# Patient Record
Sex: Female | Born: 1960 | Race: Black or African American | Hispanic: No | Marital: Single | Smoking: Never smoker
Health system: Southern US, Community
[De-identification: ages and names within clinical notes are randomized; demographics above are authoritative.]

## PROBLEM LIST (undated history)

## (undated) DIAGNOSIS — D573 Sickle-cell trait: Secondary | ICD-10-CM

## (undated) DIAGNOSIS — R51 Headache: Secondary | ICD-10-CM

## (undated) DIAGNOSIS — Z9851 Tubal ligation status: Secondary | ICD-10-CM

## (undated) DIAGNOSIS — J189 Pneumonia, unspecified organism: Secondary | ICD-10-CM

## (undated) DIAGNOSIS — Z789 Other specified health status: Secondary | ICD-10-CM

## (undated) HISTORY — PX: FOOT SURGERY: SHX648

## (undated) HISTORY — PX: COLONOSCOPY: SHX174

## (undated) HISTORY — PX: TUBAL LIGATION: SHX77

---

## 2012-09-16 ENCOUNTER — Encounter (HOSPITAL_COMMUNITY): Payer: Self-pay | Admitting: Pharmacy Technician

## 2012-09-17 ENCOUNTER — Encounter (HOSPITAL_COMMUNITY)
Admission: RE | Admit: 2012-09-17 | Discharge: 2012-09-17 | Disposition: A | Discharge status: Home / Self Care | Payer: Worker's Compensation | Source: Ambulatory Visit | Attending: Orthopedic Surgery | Admitting: Orthopedic Surgery

## 2012-09-17 ENCOUNTER — Encounter (HOSPITAL_COMMUNITY): Payer: Self-pay

## 2012-09-17 DIAGNOSIS — Z01812 Encounter for preprocedural laboratory examination: Secondary | ICD-10-CM | POA: Insufficient documentation

## 2012-09-17 HISTORY — DX: Other specified health status: Z78.9

## 2012-09-17 HISTORY — DX: Tubal ligation status: Z98.51

## 2012-09-17 HISTORY — DX: Headache: R51

## 2012-09-17 LAB — BASIC METABOLIC PANEL
CO2: 27 mEq/L (ref 19–32)
Calcium: 9.6 mg/dL (ref 8.4–10.5)
Creatinine, Ser: 0.76 mg/dL (ref 0.50–1.10)
GFR calc non Af Amer: >90 mL/min (ref >90)
Glucose, Bld: 108 mg/dL — ABNORMAL HIGH (ref 70–99)
Sodium: 140 mEq/L (ref 135–145)

## 2012-09-17 LAB — CBC
MCH: 22.2 pg — ABNORMAL LOW (ref 26.0–34.0)
MCV: 65.2 fL — ABNORMAL LOW (ref 78.0–100.0)
Platelets: 536 10*3/uL — ABNORMAL HIGH (ref 150–400)
RDW: 17.8 % — ABNORMAL HIGH (ref 11.5–15.5)
WBC: 7.1 10*3/uL (ref 4.0–10.5)

## 2012-09-17 LAB — SURGICAL PCR SCREEN: Staphylococcus aureus: NEGATIVE

## 2012-09-17 NOTE — Pre-Procedure Instructions (Signed)
KAOIR LOREE  09/17/2012   Your procedure is scheduled on:  09/23/12  Report to [facility] at 8 AM.  Call this number if you have problems the morning of surgery: [phone]   Remember:   Do not eat food or drink liquids after midnight.   Take these medicines the morning of surgery with A SIP OF WATER: none   Do not wear jewelry, make-up or nail polish.  Do not wear lotions, powders, or perfumes. You may wear deodorant.  Do not shave 48 hours prior to surgery. Men may shave face and neck.  Do not bring valuables to the hospital.  [facility] is not responsible                   for any belongings or valuables.  Contacts, dentures or bridgework may not be worn into surgery.  Leave suitcase in the car. After surgery it may be brought to your room.  For patients admitted to the hospital, checkout time is 11:00 AM the day of  discharge.   Patients discharged the day of surgery will not be allowed to drive  home.  Name and phone number of your driver: family  Special Instructions: Shower using CHG 2 nights before surgery and the night before surgery.  If you shower the day of surgery use CHG.  Use special wash - you have one bottle of CHG for all showers.  You should use approximately 1/3 of the bottle for each shower.   Please read over the following fact sheets that you were given: Pain Booklet, Coughing and Deep Breathing, MRSA Information and Surgical Site Infection Prevention

## 2012-09-20 NOTE — H&P (Signed)
Subjective Transcription  She returns today for follow up. At this point I have not seen her since May and she continues to have significant right deltoid pain and now left trapezial pain. Since I last saw her the left side of her neck has become more symptomatic. She occasionally gets some pain into the arm but most of her pain is still neck, right scapula and right proximal humerus. X-rays today in my office show no significant collapse of the disc spaces, no spondylolisthesis or spondylolysis. No sudden loss of lordosis. 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 There are no cord signal changes.   Allergies No Known Drug Allergies. 12/10/2011   Family History Cancer. sister   Social History Alcohol use. current drinker; only occasionally per week Children. 2 Current work status. working part time Drug/Alcohol Rehab (Currently). no Drug/Alcohol Rehab (Previously). no Exercise. Exercises daily; does other Illicit drug use. no Marital status. single Number of flights of stairs before winded. 1 Pain Contract. no Tobacco / smoke exposure. no Tobacco use. never smoker Current occupation. [employer]   Medication History Advil (200MG  Capsule, 1 (one) Oral) Active.   Past Surgical History Cesarean Delivery. 2 times Foot Surgery. bilateral   Other Problems No pertinent past medical history  Objective Transcription  She is a pleasant woman who appears younger than her stated age. She is alert and oriented times three. No shortness of breath or chest pain. The abdomen is soft, nontender. She has pain with cervical spine range of motion.  She has trace weakness of the right deltoid, 5/5 in the remainder of the upper extremity. Normal sensation in C6, C7, C8 distribution. Slightly diminished sensation over the lateral aspect of the right humerus. Normal gait pattern. Negative Babinski test. No clonus. Negative Hoffman sign. 1+ symmetrical upper extremity deep tendon reflexes. No incontinence of bowel or bladder.  IMAGES:  Her MRI shows a moderate stenosis at C5-6. The right neuroforamen is patent. There is moderate left foraminal stenosis. At C3-4 there is moderate-to-severe left lateral recessed stenosis. Minimal right foraminal narrowing. No cord signal changes.  Significant foraminal stenosis at C4/5  Plans Transcription  At this point in time I have had a long discussion with the patient about her care. At this point I think since she is having more and more left sided trapezial/C4 pain I think including the 3-4 level is a good idea. My concern is the C5-6 level. Radiographically there is a central disc protrusion but clinically she has no C6 radiculopathy, (neither motor nor sensory deficits) and there is no significant foraminal compromise. This is moderate disease and I do not think doing a three level procedure is worth while. I told her the down side to this is with a two level fusion above it we could transfer stress to that level, it could break down and become symptomatic. However, her plain x-rays show that the disc space is well maintained, there is no advanced collapse of that disc or traction spur or degenerative changes at that C5-6 level. At this point the risks of a two level fusion include infection, bleeding, nerve damage, death, stroke, paralysis, failure to heal, need for further surgery, throat pain, swallowing difficulties, hoarseness in the  voice, need for further surgery and it does not fuse. Because we are doing a multilevel procedure I will use an external bone stimulator after surgery for about 9 to 12 months to  encourage the fusion. She has expressed an understanding of the risks and benefits and she is familiar with the procedure. We will be planning to proceed with this next week.

## 2012-09-22 MED ORDER — CEFAZOLIN SODIUM-DEXTROSE 2-3 GM-% IV SOLR
2.0000 g | INTRAVENOUS | Status: AC
  Administered 2012-09-23: 2 g via INTRAVENOUS
  Filled 2012-09-22: qty 50

## 2012-09-23 ENCOUNTER — Ambulatory Visit (HOSPITAL_COMMUNITY): Payer: Worker's Compensation | Admitting: Certified Registered"

## 2012-09-23 ENCOUNTER — Ambulatory Visit (HOSPITAL_COMMUNITY): Payer: Worker's Compensation

## 2012-09-23 ENCOUNTER — Encounter (HOSPITAL_COMMUNITY): Payer: Self-pay | Admitting: *Deleted

## 2012-09-23 ENCOUNTER — Observation Stay (HOSPITAL_COMMUNITY): Payer: Worker's Compensation

## 2012-09-23 ENCOUNTER — Observation Stay (HOSPITAL_COMMUNITY)
Admission: RE | Admit: 2012-09-23 | Discharge: 2012-09-24 | Disposition: A | Discharge status: Home / Self Care | Payer: Worker's Compensation | Source: Ambulatory Visit | Attending: Orthopedic Surgery | Admitting: Orthopedic Surgery

## 2012-09-23 ENCOUNTER — Encounter (HOSPITAL_COMMUNITY)
Admission: RE | Disposition: A | Discharge status: Home / Self Care | Payer: Self-pay | Source: Ambulatory Visit | Attending: Orthopedic Surgery

## 2012-09-23 ENCOUNTER — Encounter (HOSPITAL_COMMUNITY): Payer: Self-pay | Admitting: Certified Registered"

## 2012-09-23 DIAGNOSIS — Z01818 Encounter for other preprocedural examination: Principal | ICD-10-CM | POA: Insufficient documentation

## 2012-09-23 HISTORY — PX: ANTERIOR CERVICAL DECOMP/DISCECTOMY FUSION: SHX1161

## 2012-09-23 SURGERY — ANTERIOR CERVICAL DECOMPRESSION/DISCECTOMY FUSION 1 LEVEL
Anesthesia: General | Site: Spine Cervical | Wound class: Clean

## 2012-09-23 MED ORDER — ONDANSETRON HCL 4 MG/2ML IJ SOLN
INTRAMUSCULAR | Status: DC | PRN
  Administered 2012-09-23: 4 mg via INTRAVENOUS

## 2012-09-23 MED ORDER — METHOCARBAMOL 100 MG/ML IJ SOLN: 500.0000 mg | Freq: Four times a day (QID) | INTRAVENOUS | Status: DC | PRN

## 2012-09-23 MED ORDER — FENTANYL CITRATE 0.05 MG/ML IJ SOLN
INTRAMUSCULAR | Status: DC | PRN
  Administered 2012-09-23: 50 ug via INTRAVENOUS
  Administered 2012-09-23: 150 ug via INTRAVENOUS
  Administered 2012-09-23: 50 ug via INTRAVENOUS

## 2012-09-23 MED ORDER — PHENYLEPHRINE HCL 10 MG/ML IJ SOLN
10.0000 mg | INTRAVENOUS | Status: DC | PRN
  Administered 2012-09-23: 25 ug/min via INTRAVENOUS

## 2012-09-23 MED ORDER — DEXAMETHASONE SODIUM PHOSPHATE 4 MG/ML IJ SOLN
4.0000 mg | Freq: Four times a day (QID) | INTRAMUSCULAR | Status: DC
  Administered 2012-09-23 – 2012-09-24 (×3): 4 mg via INTRAVENOUS
  Filled 2012-09-23 (×7): qty 1

## 2012-09-23 MED ORDER — MIDAZOLAM HCL 5 MG/5ML IJ SOLN
INTRAMUSCULAR | Status: DC | PRN
  Administered 2012-09-23: 2 mg via INTRAVENOUS

## 2012-09-23 MED ORDER — HYDROMORPHONE HCL PF 1 MG/ML IJ SOLN
0.2500 mg | INTRAMUSCULAR | Status: DC | PRN
  Administered 2012-09-23 (×2): 0.5 mg via INTRAVENOUS

## 2012-09-23 MED ORDER — ALBUMIN HUMAN 5 % IV SOLN
INTRAVENOUS | Status: DC | PRN
  Administered 2012-09-23: 11:00:00 via INTRAVENOUS

## 2012-09-23 MED ORDER — METHOCARBAMOL 500 MG PO TABS: 500.0000 mg | ORAL_TABLET | Freq: Four times a day (QID) | ORAL | Status: DC | PRN

## 2012-09-23 MED ORDER — SODIUM CHLORIDE 0.9 % IV SOLN: 250.0000 mL | INTRAVENOUS | Status: DC

## 2012-09-23 MED ORDER — LIDOCAINE HCL (CARDIAC) 20 MG/ML IV SOLN
INTRAVENOUS | Status: DC | PRN
  Administered 2012-09-23: 50 mg via INTRAVENOUS

## 2012-09-23 MED ORDER — GLYCOPYRROLATE 0.2 MG/ML IJ SOLN
INTRAMUSCULAR | Status: DC | PRN
  Administered 2012-09-23: .4 mg via INTRAVENOUS

## 2012-09-23 MED ORDER — DEXAMETHASONE 4 MG PO TABS
4.0000 mg | ORAL_TABLET | Freq: Four times a day (QID) | ORAL | Status: DC
  Filled 2012-09-23 (×7): qty 1

## 2012-09-23 MED ORDER — PHENOL 1.4 % MT LIQD: 1.0000 | OROMUCOSAL | Status: DC | PRN

## 2012-09-23 MED ORDER — ZOLPIDEM TARTRATE 5 MG PO TABS: 5.0000 mg | ORAL_TABLET | Freq: Every evening | ORAL | Status: DC | PRN

## 2012-09-23 MED ORDER — OXYCODONE HCL 5 MG PO TABS: 5.0000 mg | ORAL_TABLET | Freq: Once | ORAL | Status: DC | PRN

## 2012-09-23 MED ORDER — THROMBIN 20000 UNITS EX SOLR
CUTANEOUS | Status: DC | PRN
  Administered 2012-09-23: 12:00:00 via TOPICAL

## 2012-09-23 MED ORDER — ROCURONIUM BROMIDE 100 MG/10ML IV SOLN
INTRAVENOUS | Status: DC | PRN
  Administered 2012-09-23: 10 mg via INTRAVENOUS
  Administered 2012-09-23: 40 mg via INTRAVENOUS

## 2012-09-23 MED ORDER — PROPOFOL 10 MG/ML IV BOLUS
INTRAVENOUS | Status: DC | PRN
  Administered 2012-09-23: 120 mg via INTRAVENOUS

## 2012-09-23 MED ORDER — THROMBIN 20000 UNITS EX SOLR
CUTANEOUS | Status: AC
  Filled 2012-09-23: qty 20000

## 2012-09-23 MED ORDER — OXYCODONE HCL 5 MG/5ML PO SOLN: 5.0000 mg | Freq: Once | ORAL | Status: DC | PRN

## 2012-09-23 MED ORDER — DOCUSATE SODIUM 100 MG PO CAPS
100.0000 mg | ORAL_CAPSULE | Freq: Two times a day (BID) | ORAL | Status: DC
  Administered 2012-09-23: 100 mg via ORAL
  Filled 2012-09-23 (×3): qty 1

## 2012-09-23 MED ORDER — MORPHINE SULFATE 2 MG/ML IJ SOLN: 1.0000 mg | INTRAMUSCULAR | Status: DC | PRN

## 2012-09-23 MED ORDER — LACTATED RINGERS IV SOLN: INTRAVENOUS | Status: DC

## 2012-09-23 MED ORDER — DEXAMETHASONE SODIUM PHOSPHATE 4 MG/ML IJ SOLN: 4.0000 mg | Freq: Once | INTRAMUSCULAR | Status: DC

## 2012-09-23 MED ORDER — BUPIVACAINE-EPINEPHRINE PF 0.25-1:200000 % IJ SOLN
INTRAMUSCULAR | Status: AC
  Filled 2012-09-23: qty 30

## 2012-09-23 MED ORDER — DEXAMETHASONE SODIUM PHOSPHATE 10 MG/ML IJ SOLN
INTRAMUSCULAR | Status: AC
  Administered 2012-09-23: 10 mg via INTRAVENOUS
  Filled 2012-09-23: qty 1

## 2012-09-23 MED ORDER — BUPIVACAINE-EPINEPHRINE 0.25% -1:200000 IJ SOLN
INTRAMUSCULAR | Status: DC | PRN
  Administered 2012-09-23: 6 mL

## 2012-09-23 MED ORDER — CEFAZOLIN SODIUM 1-5 GM-% IV SOLN
1.0000 g | Freq: Three times a day (TID) | INTRAVENOUS | Status: AC
Start: 2012-09-23 — End: 2012-09-24
  Administered 2012-09-23 – 2012-09-24 (×2): 1 g via INTRAVENOUS
  Filled 2012-09-23 (×2): qty 50

## 2012-09-23 MED ORDER — EPHEDRINE SULFATE 50 MG/ML IJ SOLN
INTRAMUSCULAR | Status: DC | PRN
  Administered 2012-09-23: 5 mg via INTRAVENOUS

## 2012-09-23 MED ORDER — ACETAMINOPHEN 10 MG/ML IV SOLN
1000.0000 mg | Freq: Four times a day (QID) | INTRAVENOUS | Status: DC
  Administered 2012-09-23: 1000 mg via INTRAVENOUS

## 2012-09-23 MED ORDER — ACETAMINOPHEN 10 MG/ML IV SOLN
1000.0000 mg | Freq: Four times a day (QID) | INTRAVENOUS | Status: DC
  Administered 2012-09-23 – 2012-09-24 (×3): 1000 mg via INTRAVENOUS
  Filled 2012-09-23 (×4): qty 100

## 2012-09-23 MED ORDER — OXYCODONE HCL 5 MG PO TABS
10.0000 mg | ORAL_TABLET | ORAL | Status: DC | PRN
  Administered 2012-09-24: 5 mg via ORAL
  Administered 2012-09-24: 10 mg via ORAL
  Filled 2012-09-23: qty 2
  Filled 2012-09-23: qty 1

## 2012-09-23 MED ORDER — ONDANSETRON HCL 4 MG/2ML IJ SOLN
4.0000 mg | INTRAMUSCULAR | Status: DC | PRN
  Administered 2012-09-23: 4 mg via INTRAVENOUS
  Filled 2012-09-23: qty 2

## 2012-09-23 MED ORDER — ARTIFICIAL TEARS OP OINT
TOPICAL_OINTMENT | OPHTHALMIC | Status: DC | PRN
  Administered 2012-09-23: 1 via OPHTHALMIC

## 2012-09-23 MED ORDER — LACTATED RINGERS IV SOLN
INTRAVENOUS | Status: DC | PRN
  Administered 2012-09-23 (×2): via INTRAVENOUS

## 2012-09-23 MED ORDER — HYDROMORPHONE HCL PF 1 MG/ML IJ SOLN
INTRAMUSCULAR | Status: AC
  Filled 2012-09-23: qty 1

## 2012-09-23 MED ORDER — 0.9 % SODIUM CHLORIDE (POUR BTL) OPTIME
TOPICAL | Status: DC | PRN
  Administered 2012-09-23: 1000 mL

## 2012-09-23 MED ORDER — ACETAMINOPHEN 10 MG/ML IV SOLN
1000.0000 mg | INTRAVENOUS | Status: DC
  Filled 2012-09-23: qty 100

## 2012-09-23 MED ORDER — SODIUM CHLORIDE 0.9 % IJ SOLN: 3.0000 mL | INTRAMUSCULAR | Status: DC | PRN

## 2012-09-23 MED ORDER — WHITE PETROLATUM GEL
Status: AC
  Filled 2012-09-23: qty 5

## 2012-09-23 MED ORDER — SODIUM CHLORIDE 0.9 % IJ SOLN
3.0000 mL | Freq: Two times a day (BID) | INTRAMUSCULAR | Status: DC
  Administered 2012-09-24: 3 mL via INTRAVENOUS

## 2012-09-23 MED ORDER — NEOSTIGMINE METHYLSULFATE 1 MG/ML IJ SOLN
INTRAMUSCULAR | Status: DC | PRN
  Administered 2012-09-23: 3 mg via INTRAVENOUS

## 2012-09-23 MED ORDER — MENTHOL 3 MG MT LOZG: 1.0000 | LOZENGE | OROMUCOSAL | Status: DC | PRN

## 2012-09-23 MED ORDER — ACETAMINOPHEN 10 MG/ML IV SOLN: 1000.0000 mg | Freq: Four times a day (QID) | INTRAVENOUS | Status: DC

## 2012-09-23 MED ORDER — PHENYLEPHRINE HCL 10 MG/ML IJ SOLN
INTRAMUSCULAR | Status: DC | PRN
  Administered 2012-09-23: 120 ug via INTRAVENOUS

## 2012-09-23 MED ORDER — ONDANSETRON HCL 4 MG/2ML IJ SOLN: 4.0000 mg | Freq: Once | INTRAMUSCULAR | Status: DC | PRN

## 2012-09-23 MED ORDER — LACTATED RINGERS IV SOLN
INTRAVENOUS | Status: DC
  Administered 2012-09-23: 18:00:00 via INTRAVENOUS

## 2012-09-23 SURGICAL SUPPLY — 60 items
BIT DRILL SRG 14X2.2XFLT CHK (BIT) ×1 IMPLANT
BIT DRL SRG 14X2.2XFLT CHK (BIT) ×1
BLADE SURG ROTATE 9660 (MISCELLANEOUS) IMPLANT
BUR EGG ELITE 4.0 (BURR) IMPLANT
BUR MATCHSTICK NEURO 3.0 LAGG (BURR) IMPLANT
CANISTER SUCTION 2500CC (MISCELLANEOUS) ×2 IMPLANT
CLOTH BEACON ORANGE TIMEOUT ST (SAFETY) ×2 IMPLANT
CLSR STERI-STRIP ANTIMIC 1/2X4 (GAUZE/BANDAGES/DRESSINGS) ×2 IMPLANT
CORDS BIPOLAR (ELECTRODE) ×2 IMPLANT
COVER SURGICAL LIGHT HANDLE (MISCELLANEOUS) ×4 IMPLANT
CRADLE DONUT ADULT HEAD (MISCELLANEOUS) ×2 IMPLANT
DRAPE C-ARM 42X72 X-RAY (DRAPES) ×4 IMPLANT
DRAPE POUCH INSTRU U-SHP 10X18 (DRAPES) ×2 IMPLANT
DRAPE SURG 17X23 STRL (DRAPES) ×2 IMPLANT
DRAPE U-SHAPE 47X51 STRL (DRAPES) ×2 IMPLANT
DRILL BIT SKYLINE 14MM (BIT) ×1
DRSG MEPILEX BORDER 4X4 (GAUZE/BANDAGES/DRESSINGS) ×2 IMPLANT
DURAPREP 26ML APPLICATOR (WOUND CARE) ×2 IMPLANT
ELECT COATED BLADE 2.86 ST (ELECTRODE) ×2 IMPLANT
ELECT REM PT RETURN 9FT ADLT (ELECTROSURGICAL) ×2
ELECTRODE REM PT RTRN 9FT ADLT (ELECTROSURGICAL) ×1 IMPLANT
GLOVE BIOGEL PI IND STRL 8.5 (GLOVE) ×1 IMPLANT
GLOVE BIOGEL PI INDICATOR 8.5 (GLOVE) ×1
GLOVE ECLIPSE 8.5 STRL (GLOVE) ×2 IMPLANT
GOWN PREVENTION PLUS XXLARGE (GOWN DISPOSABLE) ×2 IMPLANT
GOWN STRL REIN XL XLG (GOWN DISPOSABLE) ×4 IMPLANT
INTERLOCK LRDTC CRVCL VBR 7MM (Bone Implant) ×1 IMPLANT
INTERLOCK LRDTC CRVCL VBR 8MM (Peek) ×1 IMPLANT
KIT BASIN OR (CUSTOM PROCEDURE TRAY) ×2 IMPLANT
KIT ROOM TURNOVER OR (KITS) ×2 IMPLANT
LORDOTIC CERVICAL VBR 7MM SM (Bone Implant) ×2 IMPLANT
LORDOTIC CERVICAL VBR 8MM SM (Peek) ×2 IMPLANT
NEEDLE SPNL 18GX3.5 QUINCKE PK (NEEDLE) ×6 IMPLANT
NS IRRIG 1000ML POUR BTL (IV SOLUTION) ×2 IMPLANT
PACK ORTHO CERVICAL (CUSTOM PROCEDURE TRAY) ×2 IMPLANT
PACK UNIVERSAL I (CUSTOM PROCEDURE TRAY) ×2 IMPLANT
PAD ARMBOARD 7.5X6 YLW CONV (MISCELLANEOUS) ×4 IMPLANT
PATTIES SURGICAL .25X.25 (GAUZE/BANDAGES/DRESSINGS) ×2 IMPLANT
PIN DISTRACTION 14 (PIN) ×4 IMPLANT
PIN TEMP SKYLINE THREADED (PIN) ×2 IMPLANT
PUTTY BONE DBX 5CC MIX (Putty) ×2 IMPLANT
RESTRAINT LIMB HOLDER UNIV (RESTRAINTS) ×2 IMPLANT
SCREW SKYLINE 14MM SD-VA (Screw) ×10 IMPLANT
SCREW SKYLINE VAR OS 14MM (Screw) ×2 IMPLANT
SPONGE INTESTINAL PEANUT (DISPOSABLE) ×4 IMPLANT
SPONGE SURGIFOAM ABS GEL 100 (HEMOSTASIS) ×2 IMPLANT
SURGIFLO TRUKIT (HEMOSTASIS) IMPLANT
SUT MNCRL AB 3-0 PS2 18 (SUTURE) ×2 IMPLANT
SUT MON AB 3-0 SH 27 (SUTURE) ×1
SUT MON AB 3-0 SH27 (SUTURE) ×1 IMPLANT
SUT SILK 2 0 (SUTURE)
SUT SILK 2-0 18XBRD TIE 12 (SUTURE) IMPLANT
SUT VIC AB 2-0 CT1 18 (SUTURE) ×2 IMPLANT
SYR BULB IRRIGATION 50ML (SYRINGE) ×2 IMPLANT
SYR CONTROL 10ML LL (SYRINGE) ×2 IMPLANT
TAPE CLOTH 4X10 WHT NS (GAUZE/BANDAGES/DRESSINGS) ×2 IMPLANT
TAPE UMBILICAL COTTON 1/8X30 (MISCELLANEOUS) ×2 IMPLANT
TOWEL OR 17X24 6PK STRL BLUE (TOWEL DISPOSABLE) ×2 IMPLANT
TOWEL OR 17X26 10 PK STRL BLUE (TOWEL DISPOSABLE) ×2 IMPLANT
WATER STERILE IRR 1000ML POUR (IV SOLUTION) IMPLANT

## 2012-09-23 NOTE — Anesthesia Procedure Notes (Signed)
Procedure Name: Intubation Date/Time: 09/23/2012 10:30 AM Performed by: Ellin Goodie Pre-anesthesia Checklist: Patient identified, Emergency Drugs available, Suction available, Patient being monitored and Timeout performed Patient Re-evaluated:Patient Re-evaluated prior to inductionOxygen Delivery Method: Circle system utilized Preoxygenation: Pre-oxygenation with 100% oxygen Intubation Type: IV induction Ventilation: Mask ventilation without difficulty and Oral airway inserted - appropriate to patient size Laryngoscope Size: Mac and 3 Grade View: Grade IV Tube type: Oral Tube size: 7.5 mm Number of attempts: 2 Airway Equipment and Method: Stylet and Bougie stylet Placement Confirmation: positive ETCO2 and breath sounds checked- equal and bilateral Secured at: 21 cm Tube secured with: Tape Dental Injury: Teeth and Oropharynx as per pre-operative assessment  Difficulty Due To: Difficulty was anticipated, Difficult Airway- due to anterior larynx, Difficult Airway- due to limited oral opening and Difficult Airway-  due to neck instability Comments: DL x 1 with MAC 3 blade with poor view.  Repositioned patient and bougie stylet utilized on second attempt with MAC 3 blade, bougie passed with ease, ETT threaded over bougie.  Dr. Noreene Larsson verified placement of ETT.  Carlynn Herald, CRNA

## 2012-09-23 NOTE — Op Note (Signed)
NAMEMarland Kitchen  KIMBERLYANN, HOLLAR NO.:  [id number]  MEDICAL RECORD NO.:  [id number]  LOCATION:  5N10C                        FACILITY:  [facility]  PHYSICIAN:  Alvy Beal, MD    DATE OF BIRTH:  Mar 20, 1960  DATE OF PROCEDURE:  09/23/2012 DATE OF DISCHARGE:                              OPERATIVE REPORT   PREOPERATIVE DIAGNOSIS:  Cervical spondylotic radiculopathy, C3-4, C4-5.  POSTOPERATIVE DIAGNOSIS:  Cervical spondylotic radiculopathy, C3-4, C4- 5.  OPERATIVE PROCEDURE:  Anterior cervical diskectomy and fusion C3-4, C4- 5.  COMPLICATIONS:  None.  FIRST ASSISTANT:  Genene Churn. Barry Dienes, PA-C.  INSTRUMENTATION SYSTEM USED:  Titan intervertebral cage, size 7 lordotic at 3, 4 and size 8 small lordotic at 4-5 packed with DBX mix with a 30 mm anterior cervical DePuy Skyline plate with 14 mm locking screws.  HISTORY:  This is a very pleasant woman who has had progressive debilitating neck and bilateral arm pain for the last 7 months.  Despite appropriate conservative care, she continued to deteriorate.  As a result of the failure to improve, we elected to proceed with surgery. All appropriate risks, benefits, and alternatives were discussed with the patient.  OPERATIVE NOTE:  The patient was brought to the operating room, placed supine on the operating table.  After successful induction of general anesthesia and endotracheal intubation, TEDs, SCDs were applied.  Rolled towels were placed between the shoulder blades.  The arms were secured down at the side and the anterior cervical spine was prepped and draped in the standard fashion.  Time-out was taken to confirm the patient procedure and all other pertinent important data.  Transverse incision was made centered over the C4 vertebral body.  This started in the midline and proceeding to the left.  Sharp dissection was carried out down to and through the platysma.  I then sharply dissected along the medial border of the  sternocleidomastoid through the deep cervical and prevertebral fascia.  I swept the esophagus, lateral to the right and identified and protected the carotid sheath with a finger.  I could not palpate the anterior cervical spine.  Thyroid retractor was placed.  I placed a needle into the 4-5 disk space and took an x-ray to confirm that I was at the appropriate level.  Once this was done, I then used bipolar electrocautery to mobilize the longus coli muscles from the midbody of C3 to the midbody of C5.  I obtained hemostasis.  I then placed a self-retaining retractor blades underneath the longus coli muscle.  Endotracheal cuff was deflated.  I expanded the retractor to the appropriate width and reinflated the cuff. Distraction pins were placed into the body of 3 and 4 and then an annulotomy was performed with a 15 blade scalpel.  Using a pituitary rongeur, curettes and Kerrison rongeurs, I removed all of the disk material.  I then released the posterior anulus and using a 1 mm Kerrison, I resected the posterior osteophyte from the bodies of 3 and 4.  The uncovertebral joints were also debrided.  At this point, I had an excellent decompression of this level.  I rasped the endplates.  I made sure I had complete removal  of the cartilaginous endplate.  Once I had bleeding subchondral bone, I then trialed and placed a 7 small intervertebral spacer, packed with DBX mix.  This was malleted down to the appropriate depth.  I had excellent purchase.  I then removed the distraction pin from C3 repositioned into C5 and then distracted the 4-5 disk space.  Using the same technique, I just used the 3-4.  I completed the diskectomy at 4-5.  Again releasing the posterior anulus and resecting the osteophytes, there was a small hard disk bone spur, as well as some component of soft disk material in the right posterolateral corner which was removed.  Once I had an adequate decompression, I again rasped the  endplates until I had bleeding subchondral bone.  Once this was confirmed, I used an 8 small intervertebral spacer, packed with DBX mix with both of impact down appropriately and then contoured an anterior cervical plate and affixed it with self-drilling screws into the bodies of C3, 4 and 5 with 14 mm screws.  All screws had excellent purchase.  I then checked to ensure the esophagus was not entrapped beneath the plate and it was not and then I locked the screws in place per manufactures standards.  I irrigated the wound copiously with normal saline and then obtained hemostasis using bipolar electrocautery.  I then returned the trachea and esophagus to the midline position, closed the platysma with interrupted 2-0 Vicryl sutures, and 3-0 Monocryl for the skin.  Steri-Strips and dry dressing were applied.  The patient was ultimately extubated and transferred to the PACU without incident.  At the end of the case, all needle and sponge counts were correct.     Alvy Beal, MD     DDB/MEDQ  D:  09/23/2012  T:  09/23/2012  Job:  [id number]

## 2012-09-23 NOTE — Transfer of Care (Signed)
Immediate Anesthesia Transfer of Care Note  Patient: Michaela Lopez  Procedure(s) Performed: Procedure(s): ACDF C3-4, 4-5 (N/A)  Patient Location: PACU  Anesthesia Type:General  Level of Consciousness: awake, alert  and oriented  Airway & Oxygen Therapy: Patient connected to face mask oxygen  Post-op Assessment: Report given to PACU RN  Post vital signs: stable  Complications: No apparent anesthesia complications

## 2012-09-23 NOTE — Brief Op Note (Signed)
09/23/2012  1:19 PM  PATIENT:  [name]  52 y.o. female  PRE-OPERATIVE DIAGNOSIS:  CERVICAL RADICULOPATHY  POST-OPERATIVE DIAGNOSIS:  CERVICAL RADICULOPATHY  PROCEDURE:  Procedure(s): ACDF C3-4, 4-5 (N/A)  SURGEON:  Surgeon(s) and Role:    * Venita Lick, MD - Primary  PHYSICIAN ASSISTANT:   ASSISTANTS: Zonia Kief  ANESTHESIA:   general  EBL:  Total I/O In: 1250 [I.V.:1000; IV Piggyback:250] Out: -   BLOOD ADMINISTERED:none  DRAINS: none   LOCAL MEDICATIONS USED:  MARCAINE     SPECIMEN:  No Specimen  DISPOSITION OF SPECIMEN:  N/A  COUNTS:  YES  TOURNIQUET:  * No tourniquets in log *  DICTATION: .Other Dictation: Dictation Number [id number]  PLAN OF CARE: Admit for overnight observation  PATIENT DISPOSITION:  PACU - hemodynamically stable.

## 2012-09-23 NOTE — H&P (Signed)
H+P reviewed  No change in clinical exam 

## 2012-09-23 NOTE — Preoperative (Signed)
Beta Blockers   Reason not to administer Beta Blockers:Not Applicable 

## 2012-09-23 NOTE — Anesthesia Postprocedure Evaluation (Signed)
  Anesthesia Post-op Note  Patient: Michaela Lopez  Procedure(s) Performed: Procedure(s): ACDF C3-4, 4-5 (N/A)  Patient Location: PACU  Anesthesia Type:General  Level of Consciousness: awake, alert  and oriented  Airway and Oxygen Therapy: Patient Spontanous Breathing and Patient connected to nasal cannula oxygen  Post-op Pain: mild  Post-op Assessment: Post-op Vital signs reviewed, Patient's Cardiovascular Status Stable, Respiratory Function Stable, Patent Airway and Pain level controlled  Post-op Vital Signs: stable  Complications: No apparent anesthesia complications

## 2012-09-23 NOTE — Plan of Care (Signed)
Problem: Consults Goal: Diagnosis - Spinal Surgery Cervical Spine Fusion: C3-C4, 4-5

## 2012-09-23 NOTE — Anesthesia Preprocedure Evaluation (Addendum)
Anesthesia Evaluation  Patient identified by MRN, date of birth, ID band Patient awake    Reviewed: Allergy & Precautions, H&P , NPO status , Patient's Chart, lab work & pertinent test results, reviewed documented beta blocker date and time   Airway Mallampati: II TM Distance: >3 FB Neck ROM: Limited    Dental  (+) Teeth Intact and Dental Advisory Given   Pulmonary  breath sounds clear to auscultation        Cardiovascular Rhythm:Regular Rate:Normal     Neuro/Psych  Headaches,    GI/Hepatic   Endo/Other    Renal/GU      Musculoskeletal   Abdominal   Peds  Hematology   Anesthesia Other Findings   Reproductive/Obstetrics                          Anesthesia Physical Anesthesia Plan  ASA: II  Anesthesia Plan: General   Post-op Pain Management:    Induction: Intravenous  Airway Management Planned: Oral ETT  Additional Equipment:   Intra-op Plan:   Post-operative Plan: Extubation in OR  Informed Consent: I have reviewed the patients History and Physical, chart, labs and discussed the procedure including the risks, benefits and alternatives for the proposed anesthesia with the patient or authorized representative who has indicated his/her understanding and acceptance.   Dental advisory given  Plan Discussed with:   Anesthesia Plan Comments: (Cervical spondylosis  Plan GA with oral ETT  Kipp Brood, MD)        Anesthesia Quick Evaluation

## 2012-09-24 ENCOUNTER — Encounter (HOSPITAL_COMMUNITY): Payer: Self-pay | Admitting: Orthopedic Surgery

## 2012-09-24 MED ORDER — ONDANSETRON HCL 4 MG PO TABS: 4.0000 mg | ORAL_TABLET | Freq: Three times a day (TID) | ORAL | Status: DC | PRN

## 2012-09-24 MED ORDER — DOCUSATE SODIUM 100 MG PO CAPS: 100.0000 mg | ORAL_CAPSULE | Freq: Three times a day (TID) | ORAL | Status: AC | PRN

## 2012-09-24 MED ORDER — METHOCARBAMOL 500 MG PO TABS: 500.0000 mg | ORAL_TABLET | Freq: Three times a day (TID) | ORAL | Status: DC | PRN

## 2012-09-24 MED ORDER — OXYCODONE-ACETAMINOPHEN 10-325 MG PO TABS: 1.0000 | ORAL_TABLET | ORAL | Status: DC | PRN

## 2012-09-24 MED ORDER — POLYETHYLENE GLYCOL 3350 17 GM/SCOOP PO POWD: 17.0000 g | Freq: Every day | ORAL | Status: DC

## 2012-09-24 NOTE — Progress Notes (Signed)
UR COMPLETED  

## 2012-09-24 NOTE — Progress Notes (Signed)
09/24/12 Contacted DR. Brooks office, workers comp cm is [employer] [phone]. Contacted Ms Orland Dec, she told me to call Tedd Sias [phone]. Contacted [employer],spoke with Olmsted Sink, they requested copy of orders, fax [phone]. They will set up HHPT and have 3N1 delivered to patient's home. Faxed orders and received confirmation.  Jacquelynn Cree RN, BSN, CCM     09/24/12 Contacted workers comp carrier, Barrister's clerk is Yetta Flock [phone]. Left voicemail requesting auth for HHPT, 3N1. Awaiting call back. Jacquelynn Cree RN, BSN, CCM

## 2012-09-24 NOTE — Discharge Summary (Signed)
Patient ID: Michaela Lopez MRN: [id number] DOB/AGE: April 02, 1960 52 y.o.  Admit date: 09/23/2012 Discharge date: 09/24/2012  Admission Diagnoses:  Active Problems:   * No active hospital problems. *   Discharge Diagnoses:  Active Problems:   * No active hospital problems. *  status post Procedure(s): ACDF C3-4, 4-5  Past Medical History  Diagnosis Date  . Headache(784.0)   . Medical history non-contributory   . H/O tubal ligation     Surgeries: Procedure(s): ACDF C3-4, 4-5 on 09/23/2012   Consultants:  none  Discharged Condition: Improved  Hospital Course: Michaela Lopez is an 52 y.o. female who was admitted 09/23/2012 for operative treatment of <principal problem not specified>. Patient failed conservative treatments (please see the history and physical for the specifics) and had severe unremitting pain that affects sleep, daily activities and work/hobbies. After pre-op clearance, the patient was taken to the operating room on 09/23/2012 and underwent  Procedure(s): ACDF C3-4, 4-5.    Patient was given perioperative antibiotics: Anti-infectives   Start     Dose/Rate Route Frequency Ordered Stop   09/23/12 1830  ceFAZolin (ANCEF) IVPB 1 g/50 mL premix     1 g 100 mL/hr over 30 Minutes Intravenous Every 8 hours 09/23/12 1736 09/24/12 0248   09/22/12 1421  ceFAZolin (ANCEF) IVPB 2 g/50 mL premix     2 g 100 mL/hr over 30 Minutes Intravenous 30 min pre-op 09/22/12 1421 09/23/12 1040       Patient was given sequential compression devices and early ambulation to prevent DVT.   Patient benefited maximally from hospital stay and there were no complications. At the time of discharge, the patient was urinating/moving their bowels without difficulty, tolerating a regular diet, pain is controlled with oral pain medications and they have been cleared by PT/OT.   Recent vital signs: Patient Vitals for the past 24 hrs:  BP Temp Temp src Pulse Resp SpO2  09/24/12 0635 140/66 mmHg  97.8 F (36.6 C) - 95 18 100 %  09/23/12 2108 134/62 mmHg 97.9 F (36.6 C) - 96 18 99 %  09/23/12 1740 138/64 mmHg 98.8 F (37.1 C) - 111 - 100 %  09/23/12 1632 - 97 F (36.1 C) - 114 20 99 %  09/23/12 1630 - - - 96 14 100 %  09/23/12 1618 119/55 mmHg - - 97 14 95 %  09/23/12 1615 - - - 110 20 100 %  09/23/12 1603 118/53 mmHg - - 107 14 99 %  09/23/12 1600 - - - 93 14 99 %  09/23/12 1548 131/53 mmHg - - 101 14 95 %  09/23/12 1545 - - - 109 15 99 %  09/23/12 1533 119/44 mmHg - - 106 17 93 %  09/23/12 1530 - - - 96 17 99 %  09/23/12 1518 120/36 mmHg - - 108 37 99 %  09/23/12 1515 - 97.4 F (36.3 C) - 116 20 99 %  09/23/12 1503 114/48 mmHg - - 93 13 95 %  09/23/12 1500 - - - 102 17 99 %  09/23/12 1448 117/46 mmHg - - 99 14 94 %  09/23/12 1445 - - - 94 17 99 %  09/23/12 1433 121/44 mmHg - - 97 17 98 %  09/23/12 1430 - - - 90 14 99 %  09/23/12 1418 116/60 mmHg - - 96 13 97 %  09/23/12 1415 - - - 96 12 98 %  09/23/12 1403 105/49 mmHg - - 97 14 88 %  09/23/12 1400 - - - 102 25 94 %  09/23/12 1348 120/50 mmHg - - 98 15 93 %  09/23/12 1345 - 97.3 F (36.3 C) - 105 - 100 %  09/23/12 0856 158/62 mmHg 97.3 F (36.3 C) Oral 94 18 100 %     Recent laboratory studies: No results found for this basename: WBC, HGB, HCT, PLT, NA, K, CL, CO2, BUN, CREATININE, GLUCOSE, PT, INR, CALCIUM, 2,  in the last 72 hours   Discharge Medications:     Medication List    STOP taking these medications       traMADol 50 MG tablet  Commonly known as:  ULTRAM      TAKE these medications       docusate sodium 100 MG capsule  Commonly known as:  COLACE  Take 1 capsule (100 mg total) by mouth 3 (three) times daily as needed for constipation.     methocarbamol 500 MG tablet  Commonly known as:  ROBAXIN  Take 1 tablet (500 mg total) by mouth 3 (three) times daily as needed.     ondansetron 4 MG tablet  Commonly known as:  ZOFRAN  Take 1 tablet (4 mg total) by mouth every 8 (eight) hours as needed  for nausea.     oxyCODONE-acetaminophen 10-325 MG per tablet  Commonly known as:  PERCOCET  Take 1 tablet by mouth every 4 (four) hours as needed for pain.     polyethylene glycol powder powder  Commonly known as:  GLYCOLAX  Take 17 g by mouth daily.        Diagnostic Studies: Dg Cervical Spine 2 Or 3 Views  09/23/2012   *RADIOLOGY REPORT*  Clinical Data: ACDF C3-4 and C4-5.  CERVICAL SPINE - 2-3 VIEW  Comparison: Intraoperative cervical spine x-rays earlier same date and two-view cervical spine x-rays performed preoperatively earlier same date.  Findings: Examination was performed in a cervical collar.  ACDF with hardware C3-C5 with interbody fusion plugs at C3-4 and C4-5. Alignment anatomic.  Fusion plugs properly positioned in the disc spaces.  No acute complicating features.  IMPRESSION: Anatomic alignment post ACDF with hardware C3-C5 and interbody fusion plugs at C3-4 and C4-5.  No complicating features.   Original Report Authenticated By: Hulan Saas, M.D.   Dg Cervical Spine 2-3 Views  09/23/2012   *RADIOLOGY REPORT*  Clinical Data: ACDF with hardware C3-C5.  OPERATIVE CERVICAL SPINE - 2-3 VIEW  Comparison:  Cervical spine x-rays earlier same date.  Findings: AP and lateral spot images of the cervical spine obtained with the C-arm fluoroscopic device and submitted for interpretation postoperatively demonstrate ACDF with hardware from C3-C5. Interbody fusion plugs at C3-4 and C4-5.  Plugs appropriately positioned in the disc spaces.  Anatomic alignment.  No visible complicating features.  IMPRESSION: Anatomic alignment post ACDF with hardware at C3-4 and C4-5 with interbody fusion plugs in the disc spaces.  No acute complicating features.   Original Report Authenticated By: Hulan Saas, M.D.   Dg Cervical Spine 2 Or 3 Views  09/23/2012   *RADIOLOGY REPORT*  Clinical Data: Neck pain.  CERVICAL SPINE - 2-3 VIEW  Comparison: None.  Findings: No fracture or spondylolisthesis is noted.   Minimal disc space narrowing is noted at C5-6 with minimal anterior osteophyte formation seen at this level as well as C4-5.  Posterior facet joints appear normal.  IMPRESSION: Minimal degenerative changes as described above.  No acute abnormality seen in cervical spine.   Original Report Authenticated By: Roque Lias  Montez Hageman.,  M.D.   Dg C-arm 61-120 Min  09/23/2012   *RADIOLOGY REPORT*  Clinical Data: ACDF with hardware C3-C5.  OPERATIVE CERVICAL SPINE - 2-3 VIEW  Comparison:  Cervical spine x-rays earlier same date.  Findings: AP and lateral spot images of the cervical spine obtained with the C-arm fluoroscopic device and submitted for interpretation postoperatively demonstrate ACDF with hardware from C3-C5. Interbody fusion plugs at C3-4 and C4-5.  Plugs appropriately positioned in the disc spaces.  Anatomic alignment.  No visible complicating features.  IMPRESSION: Anatomic alignment post ACDF with hardware at C3-4 and C4-5 with interbody fusion plugs in the disc spaces.  No acute complicating features.   Original Report Authenticated By: Hulan Saas, M.D.          Follow-up Information   Follow up with Alvy Beal, MD. Schedule an appointment as soon as possible for a visit in 2 weeks.   Specialty:  Orthopedic Surgery   Contact information:   [address] [place] [address] [phone]       Discharge Plan:  discharge to stable  Disposition: home    Signed: Venita Lick D for Dr. Venita Lick [facility] [phone] 09/24/2012, 7:57 AM

## 2012-09-24 NOTE — Progress Notes (Signed)
    Subjective: Procedure(s) (LRB): ACDF C3-4, 4-5 (N/A) 1 Day Post-Op  Patient reports pain as 2 on 0-10 scale.  Reports decreased arm pain reports incisional neck pain   Positive void Negative bowel movement Positive flatus Negative chest pain or shortness of breath  Objective: Vital signs in last 24 hours: Temp:  [97 F (36.1 C)-98.8 F (37.1 C)] 97.8 F (36.6 C) (08/29 0635) Pulse Rate:  [90-116] 95 (08/29 0635) Resp:  [12-37] 18 (08/29 0635) BP: (105-158)/(36-66) 140/66 mmHg (08/29 0635) SpO2:  [88 %-100 %] 100 % (08/29 0635)  Intake/Output from previous day: 08/28 0701 - 08/29 0700 In: 1695 [P.O.:120; I.V.:1325; IV Piggyback:250] Out: -   Labs: No results found for this basename: WBC, RBC, HCT, PLT,  in the last 72 hours No results found for this basename: NA, K, CL, CO2, BUN, CREATININE, GLUCOSE, CALCIUM,  in the last 72 hours No results found for this basename: LABPT, INR,  in the last 72 hours  Physical Exam: Neurologically intact ABD soft Neurovascular intact Intact pulses distally Incision: dressing C/D/I and no drainage Compartment soft  Assessment/Plan: Patient stable  xrays satisfactoryn with physical therapy Encourage incentive spirometry Continue care  Advance diet Up with therapy D/C IV fluids D/C to home  Venita Lick, MD [facility] [phone]

## 2012-09-24 NOTE — Evaluation (Signed)
Physical Therapy Evaluation Patient Details Name: Michaela Lopez MRN: [id number] DOB: 17-Dec-1960 Today's Date: 09/24/2012 Time: 4098-1191 PT Time Calculation (min): 23 min  PT Assessment / Plan / Recommendation History of Present Illness  s/p ACDF C3-C4, C4-C5  Clinical Impression  Patient is s/p ACDF C3-C5 surgery resulting in functional limitations due to the deficits listed below (see PT Problem List).  Patient will benefit from skilled PT in acute setting to increase their independence and safety with mobility to allow discharge to the venue listed below. Pt is safe to D/C home today with family. Provided pt with handout and education on body mechanics and cervical precautions. Pt is a supervision level for mobility and transfers.      PT Assessment  Patient needs continued PT services    Follow Up Recommendations  Home health PT;Supervision - Intermittent;Supervision for mobility/OOB    Does the patient have the potential to tolerate intense rehabilitation      Barriers to Discharge   none    Equipment Recommendations  3in1 (PT)    Recommendations for Other Services OT consult   Frequency Min 5X/week    Precautions / Restrictions Precautions Precautions: Cervical Precaution Comments: pt given cervical precautions handout and reviewed thouroughly Required Braces or Orthoses: Cervical Brace Cervical Brace: Hard collar;At all times Restrictions Weight Bearing Restrictions: No   Pertinent Vitals/Pain 6/10 "mainly with swallowing"; pt repositioned for comfort       Mobility  Bed Mobility Bed Mobility: Rolling Left;Left Sidelying to Sit Rolling Left: 4: Min guard Left Sidelying to Sit: 4: Min guard;HOB flat Details for Bed Mobility Assistance: cues for log rolling technique to adhere to cervical precautions; pt requried min guard to initiate log rolling technique and tactile cues for sequencing; pt educated on having family member to support her; pt reports she may sleep  in recliner till HHPT arrives to practice bed mobility at home Transfers Transfers: Sit to Stand;Stand to Sit Sit to Stand: 5: Supervision;From bed Stand to Sit: 5: Supervision;To chair/3-in-1;With armrests Details for Transfer Assistance: supervision for cues and safety with transfers; no physical (A) needed  Ambulation/Gait Ambulation/Gait Assistance: 5: Supervision Ambulation Distance (Feet): 125 Feet Assistive device: Other (Comment) (iv pole) Ambulation/Gait Assistance Details: pt amb with iv pole supporting Lt UE to steady; pt encouraged to amb with SPC when she returns home for safety; min cues for safety with turns to adhere to cervical precautions Gait Pattern: Step-through pattern Gait velocity: decreased  Stairs: Yes Stairs Assistance: 4: Min guard Stair Management Technique: No rails;Forwards;Step to pattern Number of Stairs: 2 Wheelchair Mobility Wheelchair Mobility: No    Exercises General Exercises - Lower Extremity Ankle Circles/Pumps: AROM;Both;10 reps;Supine   PT Diagnosis: Acute pain;Difficulty walking  PT Problem List: Decreased mobility;Decreased knowledge of use of DME;Pain PT Treatment Interventions: DME instruction;Gait training;Stair training;Functional mobility training;Therapeutic activities;Balance training;Neuromuscular re-education;Therapeutic exercise;Patient/family education     PT Goals(Current goals can be found in the care plan section) Acute Rehab PT Goals Patient Stated Goal: to go home today PT Goal Formulation: With patient Time For Goal Achievement: 09/26/12 Potential to Achieve Goals: Good  Visit Information  Last PT Received On: 09/24/12 Assistance Needed: +1 History of Present Illness: s/p ACDF C3-C4, C4-C5       Prior Functioning  Home Living Family/patient expects to be discharged to:: Private residence Living Arrangements: Children Available Help at Discharge: Family;Available 24 hours/day Type of Home: House Home Access:  Stairs to enter [employer] of Steps: 3 Entrance Stairs-Rails: None Home Layout:  One level Home Equipment: Cane - single point Additional Comments: fell at work 1 year ago; has been using a SPC she has used since then due to neck and knee pain; has a tub shower; standard toilet seat on main level Prior Function Level of Independence: Independent (prior to fall 1 year ago) Communication Communication: No difficulties Dominant Hand: Right    Cognition  Cognition Arousal/Alertness: Awake/alert Behavior During Therapy: WFL for tasks assessed/performed Overall Cognitive Status: Within Functional Limits for tasks assessed    Extremity/Trunk Assessment Upper Extremity Assessment Upper Extremity Assessment: Overall WFL for tasks assessed Lower Extremity Assessment Lower Extremity Assessment: Overall WFL for tasks assessed Cervical / Trunk Assessment Cervical / Trunk Assessment: Normal   Balance Balance Balance Assessed: Yes Static Sitting Balance Static Sitting - Balance Support: No upper extremity supported;Feet supported Static Sitting - Level of Assistance: 6: Modified independent (Device/Increase time)  End of Session PT - End of Session Equipment Utilized During Treatment: Cervical collar Activity Tolerance: Patient tolerated treatment well Patient left: in chair;with call bell/phone within reach Nurse Communication: Mobility status  GP Functional Assessment Tool Used: clinical judgement Functional Limitation: Mobility: Walking and moving around Mobility: Walking and Moving Around Current Status (M5784): At least 1 percent but less than 20 percent impaired, limited or restricted Mobility: Walking and Moving Around Goal Status [phone]): 0 percent impaired, limited or restricted Mobility: Walking and Moving Around Discharge Status [phone]): At least 1 percent but less than 20 percent impaired, limited or restricted   Donell Sievert, [place] [phone] 09/24/2012, 9:07 AM

## 2012-09-24 NOTE — Progress Notes (Signed)
Orthopedic Tech Progress Note Patient Details:  Michaela Lopez [birth year] [id number]  Patient ID: [name], female   DOB: 10/19/1960, 52 y.o.   MRN: [id number]   [name] 09/24/2012, 8:51 AMCalled [employer] for aspen collar.

## 2012-09-24 NOTE — Progress Notes (Signed)
Orthopedic Tech Progress Note Patient Details:  Michaela Lopez 06-04-1960 [id number]  Patient ID: [name], female   DOB: 1960-08-22, 52 y.o.   MRN: [id number]   [name] 09/24/2012, 11:57 AMAspen collar completed by [employer].

## 2012-09-24 NOTE — Evaluation (Signed)
Occupational Therapy Evaluation Patient Details Name: Michaela Lopez MRN: [id number] DOB: 28-Mar-1960 Today's Date: 09/24/2012 Time: 4401-0272 OT Time Calculation (min): 24 min  OT Assessment / Plan / Recommendation History of present illness s/p ACDF C3-C4, C4-C5   Clinical Impression   Pt overall at supervision to min assist with functional mobility and ADL. She has family assist at d/c. Educated on cervical precautions and DME. If here after today, could benefit from an additional session to practice ADL further, otherwise, d/c home with HHOT to followup.    OT Assessment  Patient needs continued OT Services    Follow Up Recommendations  Home health OT;Supervision/Assistance - 24 hour    Barriers to Discharge      Equipment Recommendations  3 in 1 bedside comode    Recommendations for Other Services    Frequency  Min 2X/week    Precautions / Restrictions Precautions Precautions: Cervical Precaution Comments: pt given cervical precautions handout and reviewed thouroughly Required Braces or Orthoses: Cervical Brace Cervical Brace: Hard collar;At all times   Pertinent Vitals/Pain 5-6/10 neck; rest    ADL  Eating/Feeding: Simulated;Independent Where Assessed - Eating/Feeding: Chair Grooming: Performed;Wash/dry hands;Min guard Where Assessed - Grooming: Unsupported standing Upper Body Bathing: Simulated;Chest;Right arm;Left arm;Abdomen;Set up;Supervision/safety Where Assessed - Upper Body Bathing: Unsupported sitting Lower Body Bathing: Simulated;Min guard Where Assessed - Lower Body Bathing: Supported sit to stand Upper Body Dressing: Simulated;Minimal assistance Where Assessed - Upper Body Dressing: Unsupported sitting Lower Body Dressing: Simulated;Min guard--able to cross legs up to don/doff socks Where Assessed - Lower Body Dressing: Supported sit to Pharmacist, hospital: Performed;Min Psychologist, sport and exercise: Raised toilet seat with arms (or 3-in-1 over  toilet) Toileting - Clothing Manipulation and Hygiene: Performed;Min guard Where Assessed - Engineer, mining and Hygiene: Sit to stand from 3-in-1 or toilet ADL Comments: Reviewed cervical handout with precautions thoroughly with pt. Demonstrated tub transfer technique (side step) and discussed tubseat and pt will contact case worker about obtaining a seat/coverage. Nursing states sh will educate pt on when MD approves shower, etc. Pt able to cross legs up well for LB dressing. Discussed making sure she doesnt bend over to perform toilet hygiene (to avoid neck flexion/movement) and instead if she wants to perform in standing to squat with her knees to wipe, keeping her head up and looking forward. Issued theraputty and ball for grip strengthening as her grip was somewhat decreased. Instructed on exercises. Pt states she has assist at d/c available.     OT Diagnosis: Generalized weakness  OT Problem List: Decreased strength;Decreased knowledge of use of DME or AE;Decreased knowledge of precautions OT Treatment Interventions: Self-care/ADL training;DME and/or AE instruction;Patient/family education   OT Goals(Current goals can be found in the care plan section) Acute Rehab OT Goals Patient Stated Goal: none stated OT Goal Formulation: With patient Time For Goal Achievement: 10/01/12 Potential to Achieve Goals: Good  Visit Information  Last OT Received On: 09/24/12 Assistance Needed: +1 History of Present Illness: s/p ACDF C3-C4, C4-C5       Prior Functioning     Home Living Family/patient expects to be discharged to:: Private residence Living Arrangements: Children Available Help at Discharge: Family;Available 24 hours/day Type of Home: House Home Access: Stairs to enter [employer] of Steps: 3 Entrance Stairs-Rails: None Home Layout: One level Home Equipment: Cane - single point Additional Comments: fell at work 1 year ago; has been using a SPC she has  used since then due to neck and knee pain; has  a tub shower; standard toilet seat on main level Prior Function Level of Independence: Independent (prior to fall 1 year ago) Communication Communication: No difficulties Dominant Hand: Right         Vision/Perception     Cognition  Cognition Arousal/Alertness: Awake/alert Behavior During Therapy: WFL for tasks assessed/performed Overall Cognitive Status: Within Functional Limits for tasks assessed    Extremity/Trunk Assessment Upper Extremity Assessment Upper Extremity Assessment: Overall WFL for tasks assessed (did issue theraputty and ball for grip strengthening. )     Mobility Transfers Transfers: Sit to Stand;Stand to Sit Sit to Stand: 5: Supervision;With upper extremity assist;From chair/3-in-1 Stand to Sit: 5: Supervision;With upper extremity assist;To chair/3-in-1 Details for Transfer Assistance: supervision for cues and safety with transfers; no physical (A) needed      Exercise     Balance     End of Session OT - End of Session Activity Tolerance: Patient tolerated treatment well Patient left: in chair;with call bell/phone within reach  GO Functional Assessment Tool Used: clinical judgement Functional Limitation: Self care Self Care Current Status (Z6109): At least 1 percent but less than 20 percent impaired, limited or restricted Self Care Goal Status (U0454): At least 1 percent but less than 20 percent impaired, limited or restricted   Lennox Laity [phone] 09/24/2012, 2:19 PM

## 2012-10-19 NOTE — Progress Notes (Signed)
SW received a consult for possible placement. PT  At this time is recommending home with HH and not SNF.  Clinical Social Worker will sign off for now as social work intervention is no longer needed. Please consult us again if new need arises.   Clester Chlebowski, MSW [phone] 

## 2013-11-25 IMAGING — CR DG CERVICAL SPINE 2 OR 3 VIEWS
3 series · 3 of 3 positions shown · non-contrast
Comparison: None.

CLINICAL DATA: Neck pain.

CERVICAL SPINE - 2-3 VIEW

[w cervical spine lat]
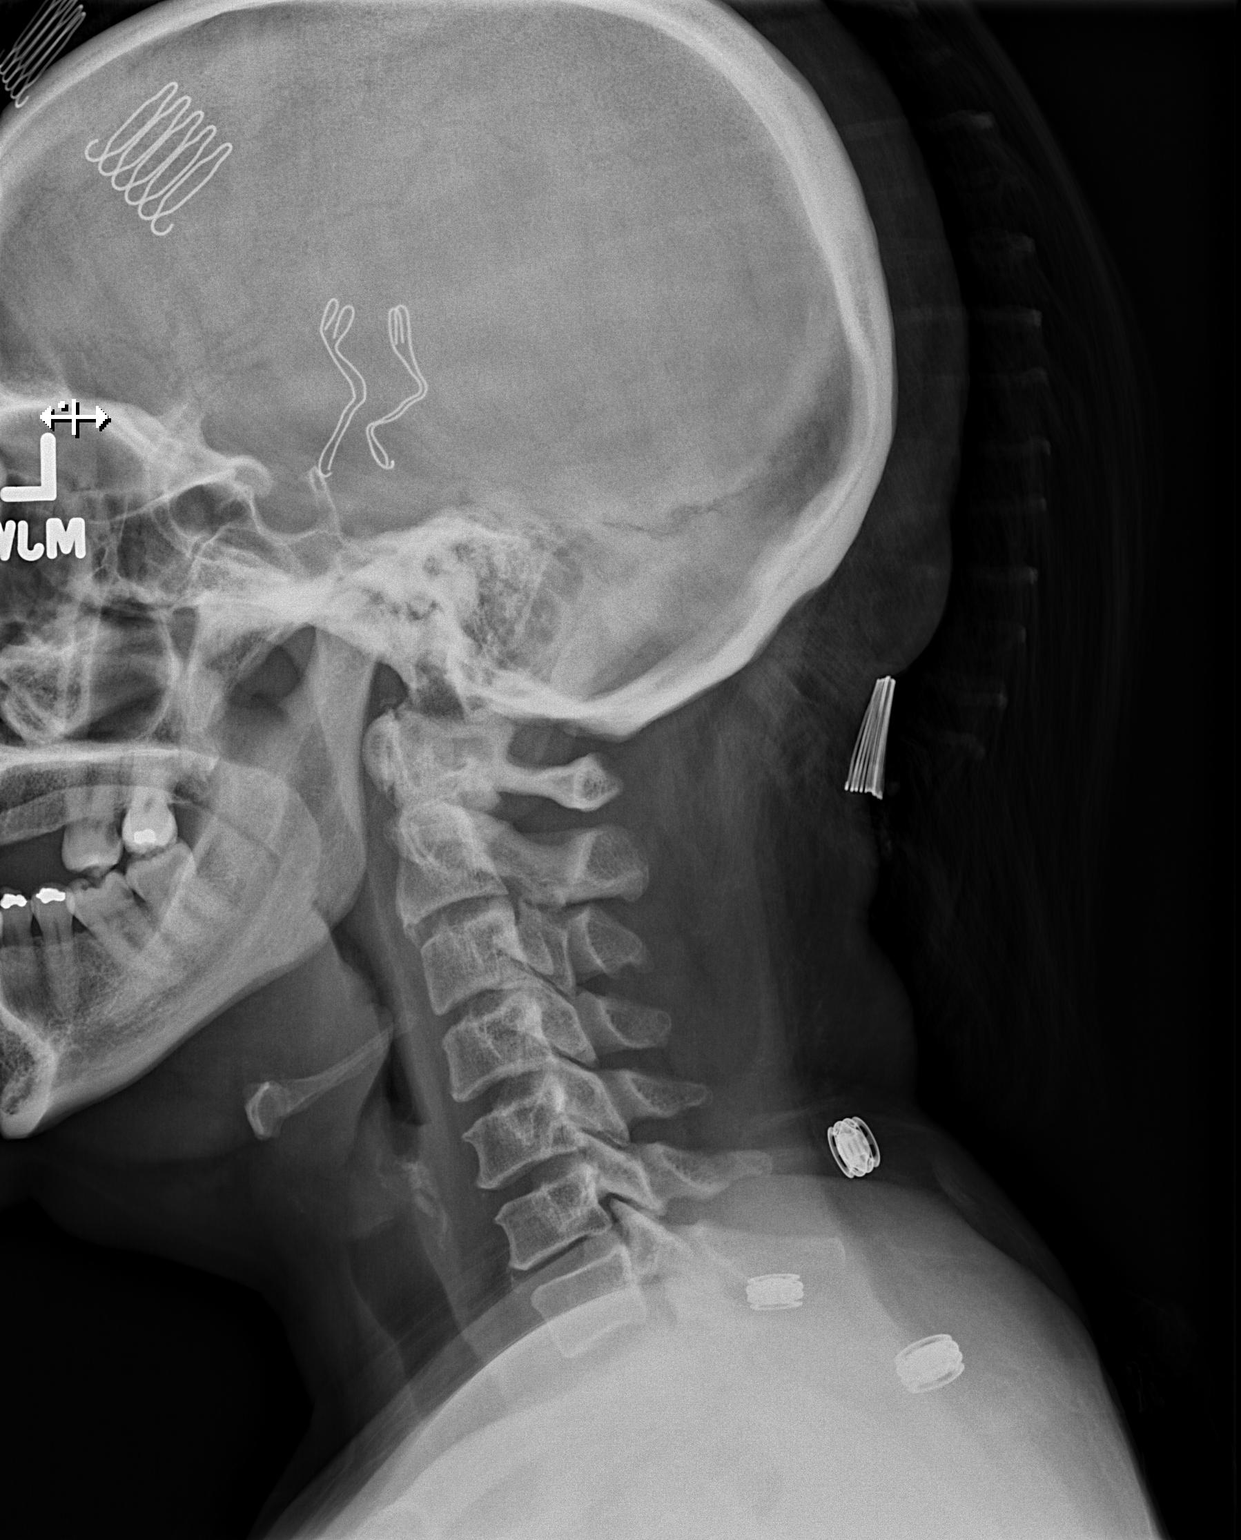

[w cervical spine ap (1 of 2)]
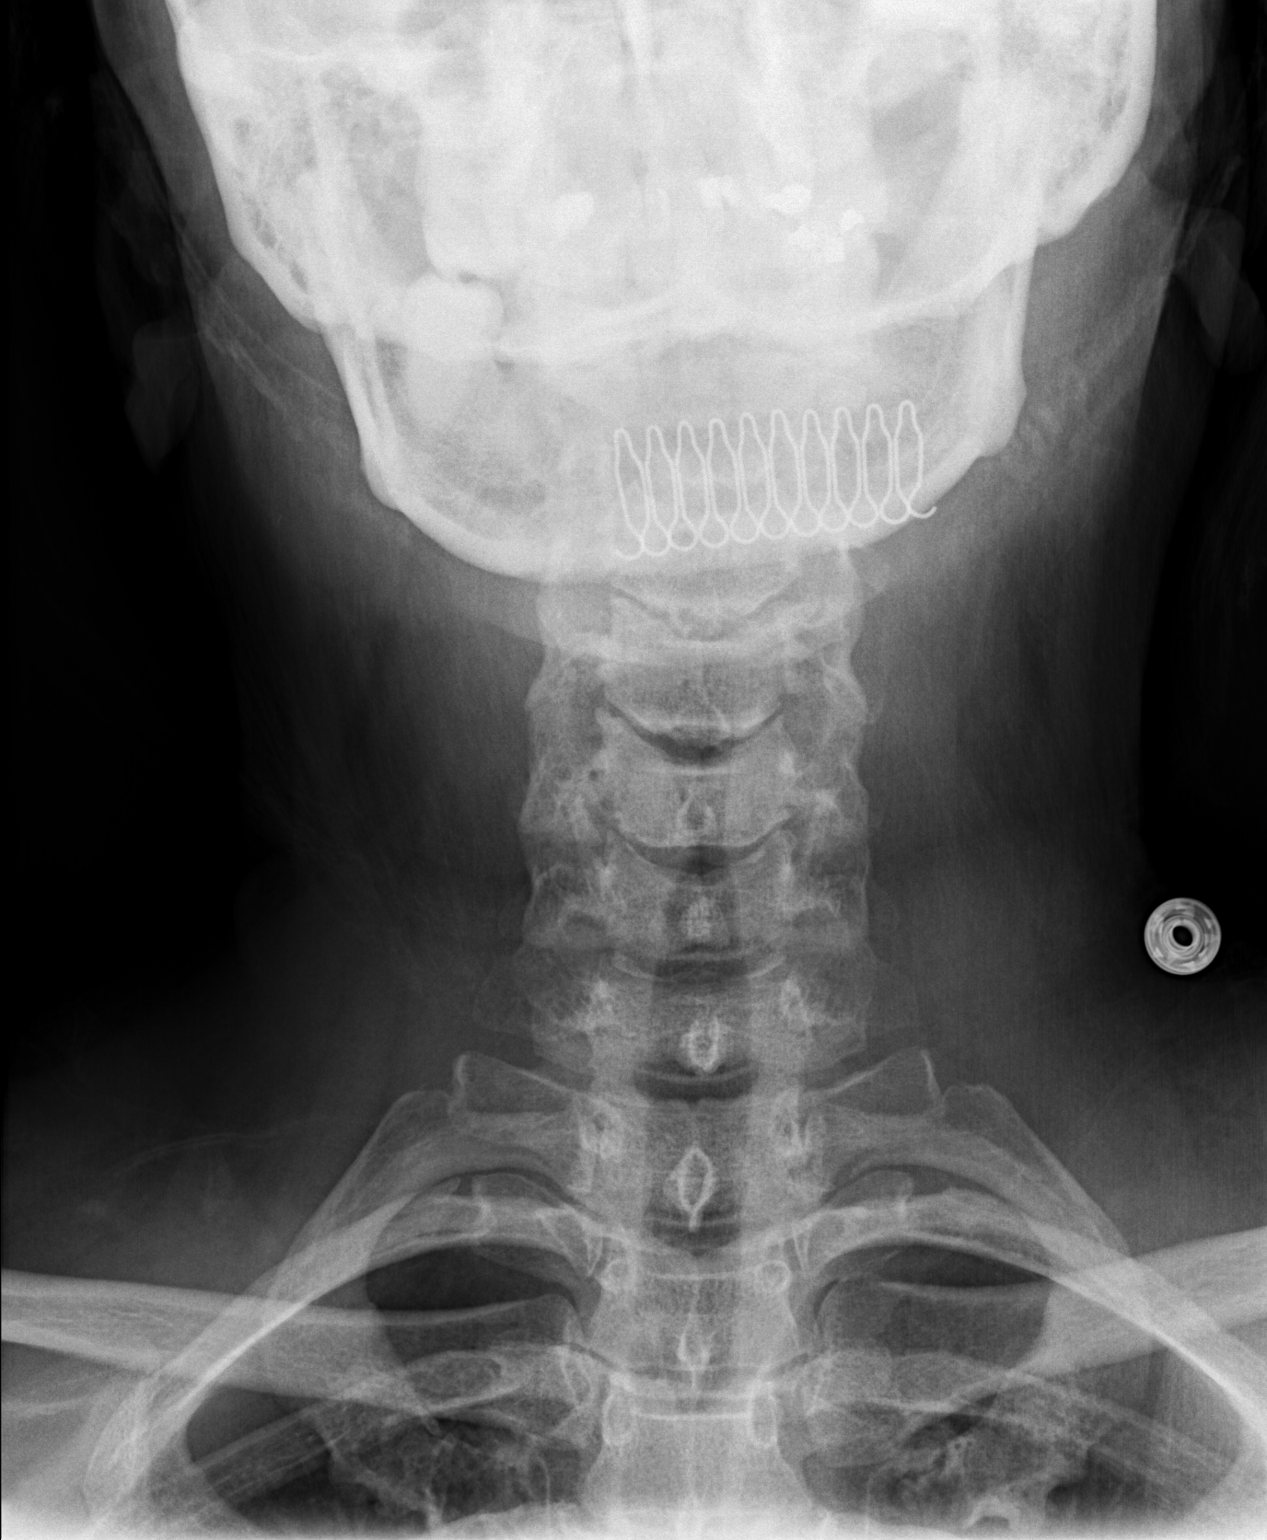

[w cervical spine ap (2 of 2)]
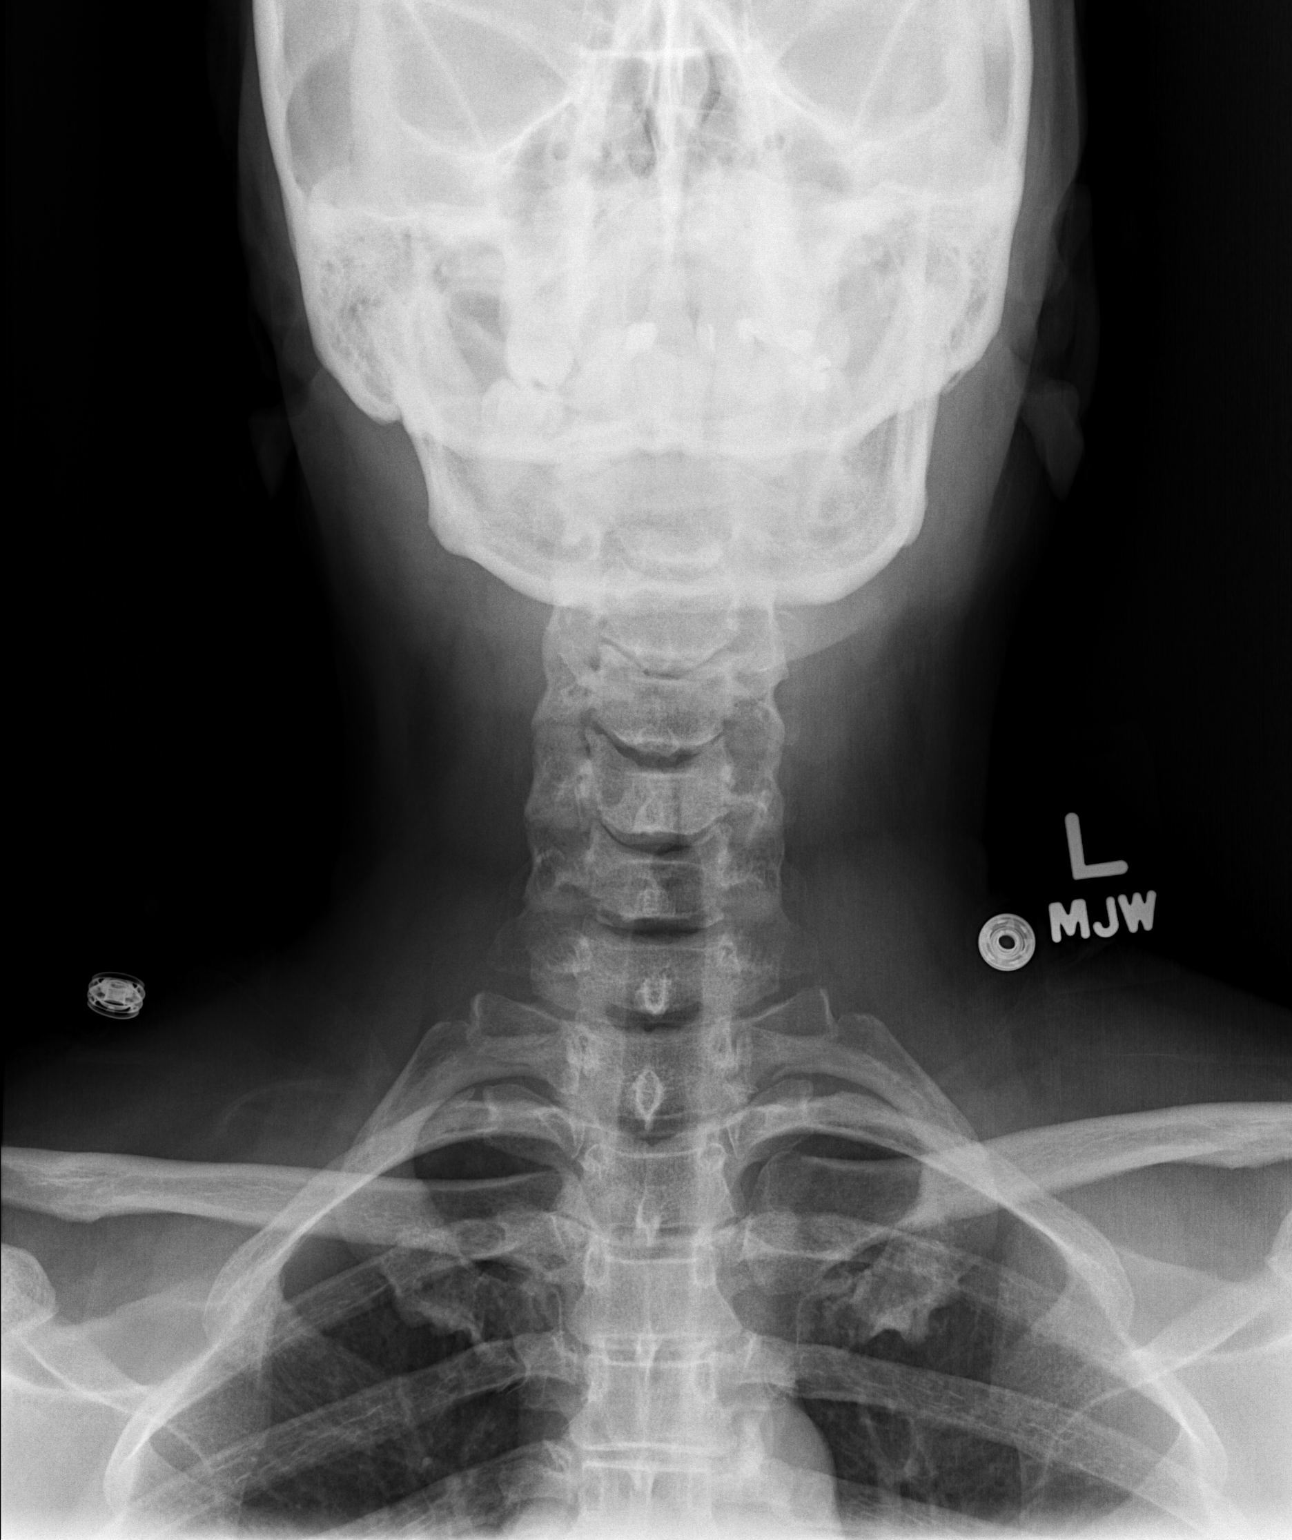

[3 of 3 positions shown; findings below may reference images not displayed]

FINDINGS: No fracture or spondylolisthesis is noted.  Minimal disc
space narrowing is noted at C5-6 with minimal anterior osteophyte
formation seen at this level as well as C4-5.  Posterior facet
joints appear normal.
IMPRESSION: Minimal degenerative changes as described above.  No acute
abnormality seen in cervical spine.

## 2014-09-05 ENCOUNTER — Encounter (HOSPITAL_COMMUNITY): Payer: Self-pay | Admitting: *Deleted

## 2014-09-06 ENCOUNTER — Encounter (HOSPITAL_COMMUNITY): Payer: Self-pay | Admitting: Certified Registered Nurse Anesthetist

## 2014-09-06 ENCOUNTER — Inpatient Hospital Stay (HOSPITAL_COMMUNITY): Payer: Worker's Compensation | Admitting: Certified Registered Nurse Anesthetist

## 2014-09-06 ENCOUNTER — Encounter (HOSPITAL_COMMUNITY)
Admission: RE | Disposition: A | Discharge status: Home / Self Care | Payer: Self-pay | Source: Ambulatory Visit | Attending: Orthopedic Surgery

## 2014-09-06 ENCOUNTER — Observation Stay (HOSPITAL_COMMUNITY)
Admission: RE | Admit: 2014-09-06 | Discharge: 2014-09-07 | Disposition: A | Discharge status: Home / Self Care | Payer: Worker's Compensation | Source: Ambulatory Visit | Attending: Orthopedic Surgery | Admitting: Orthopedic Surgery

## 2014-09-06 ENCOUNTER — Inpatient Hospital Stay (HOSPITAL_COMMUNITY): Payer: Worker's Compensation

## 2014-09-06 ENCOUNTER — Observation Stay (HOSPITAL_COMMUNITY): Payer: Worker's Compensation

## 2014-09-06 DIAGNOSIS — M4712 Other spondylosis with myelopathy, cervical region: Secondary | ICD-10-CM | POA: Diagnosis present

## 2014-09-06 DIAGNOSIS — G959 Disease of spinal cord, unspecified: Secondary | ICD-10-CM | POA: Diagnosis present

## 2014-09-06 DIAGNOSIS — Z419 Encounter for procedure for purposes other than remedying health state, unspecified: Secondary | ICD-10-CM

## 2014-09-06 DIAGNOSIS — Z981 Arthrodesis status: Secondary | ICD-10-CM | POA: Insufficient documentation

## 2014-09-06 DIAGNOSIS — D573 Sickle-cell trait: Secondary | ICD-10-CM | POA: Diagnosis not present

## 2014-09-06 HISTORY — DX: Sickle-cell trait: D57.3

## 2014-09-06 HISTORY — PX: ANTERIOR CERVICAL DECOMP/DISCECTOMY FUSION: SHX1161

## 2014-09-06 HISTORY — DX: Pneumonia, unspecified organism: J18.9

## 2014-09-06 LAB — BASIC METABOLIC PANEL
ANION GAP: 12 (ref 5–15)
BUN: 8 mg/dL (ref 6–20)
CALCIUM: 9.3 mg/dL (ref 8.9–10.3)
CHLORIDE: 107 mmol/L (ref 101–111)
CO2: 22 mmol/L (ref 22–32)
Creatinine, Ser: 0.83 mg/dL (ref 0.44–1.00)
GFR calc non Af Amer: >60 mL/min (ref >60)
GLUCOSE: 98 mg/dL (ref 65–99)
POTASSIUM: 3.3 mmol/L — AB (ref 3.5–5.1)
SODIUM: 141 mmol/L (ref 135–145)

## 2014-09-06 LAB — HCG, SERUM, QUALITATIVE: PREG SERUM: NEGATIVE

## 2014-09-06 LAB — CBC
HCT: 38.2 % (ref 36.0–46.0)
Hemoglobin: 13.2 g/dL (ref 12.0–15.0)
MCH: 28.3 pg (ref 26.0–34.0)
MCHC: 34.6 g/dL (ref 30.0–36.0)
MCV: 82 fL (ref 78.0–100.0)
PLATELETS: 428 10*3/uL — AB (ref 150–400)
RBC: 4.66 MIL/uL (ref 3.87–5.11)
RDW: 18.2 % — AB (ref 11.5–15.5)
WBC: 8.2 10*3/uL (ref 4.0–10.5)

## 2014-09-06 LAB — SURGICAL PCR SCREEN
MRSA, PCR: POSITIVE — AB
STAPHYLOCOCCUS AUREUS: POSITIVE — AB

## 2014-09-06 SURGERY — ANTERIOR CERVICAL DECOMPRESSION/DISCECTOMY FUSION 1 LEVEL/HARDWARE REMOVAL
Anesthesia: General | Site: Spine Cervical

## 2014-09-06 MED ORDER — METHOCARBAMOL 500 MG PO TABS: 500.0000 mg | ORAL_TABLET | Freq: Four times a day (QID) | ORAL | Status: DC | PRN

## 2014-09-06 MED ORDER — METHOCARBAMOL 500 MG PO TABS: 500.0000 mg | ORAL_TABLET | Freq: Three times a day (TID) | ORAL | Status: AC | PRN

## 2014-09-06 MED ORDER — ACETAMINOPHEN 10 MG/ML IV SOLN
INTRAVENOUS | Status: DC | PRN
  Administered 2014-09-06: 1000 mg via INTRAVENOUS

## 2014-09-06 MED ORDER — PHENOL 1.4 % MT LIQD: 1.0000 | OROMUCOSAL | Status: DC | PRN

## 2014-09-06 MED ORDER — OXYCODONE-ACETAMINOPHEN 10-325 MG PO TABS: 1.0000 | ORAL_TABLET | ORAL | Status: AC | PRN

## 2014-09-06 MED ORDER — OXYCODONE HCL 5 MG PO TABS
10.0000 mg | ORAL_TABLET | ORAL | Status: DC | PRN
  Administered 2014-09-07: 10 mg via ORAL
  Filled 2014-09-06: qty 2

## 2014-09-06 MED ORDER — CEFAZOLIN SODIUM-DEXTROSE 2-3 GM-% IV SOLR
2.0000 g | INTRAVENOUS | Status: AC
  Administered 2014-09-06: 2 g via INTRAVENOUS

## 2014-09-06 MED ORDER — ROCURONIUM BROMIDE 50 MG/5ML IV SOLN
INTRAVENOUS | Status: AC
  Filled 2014-09-06: qty 1

## 2014-09-06 MED ORDER — SODIUM CHLORIDE 0.9 % IJ SOLN: 3.0000 mL | INTRAMUSCULAR | Status: DC | PRN

## 2014-09-06 MED ORDER — CEFAZOLIN SODIUM-DEXTROSE 2-3 GM-% IV SOLR
INTRAVENOUS | Status: AC
  Filled 2014-09-06: qty 50

## 2014-09-06 MED ORDER — 0.9 % SODIUM CHLORIDE (POUR BTL) OPTIME
TOPICAL | Status: DC | PRN
  Administered 2014-09-06: 1000 mL

## 2014-09-06 MED ORDER — HEMOSTATIC AGENTS (NO CHARGE) OPTIME
TOPICAL | Status: DC | PRN
  Administered 2014-09-06: 1 via TOPICAL

## 2014-09-06 MED ORDER — OXYCODONE HCL 5 MG PO TABS: 5.0000 mg | ORAL_TABLET | Freq: Once | ORAL | Status: DC | PRN

## 2014-09-06 MED ORDER — CEFAZOLIN SODIUM 1-5 GM-% IV SOLN
1.0000 g | Freq: Three times a day (TID) | INTRAVENOUS | Status: AC
  Administered 2014-09-06 – 2014-09-07 (×2): 1 g via INTRAVENOUS
  Filled 2014-09-06 (×2): qty 50

## 2014-09-06 MED ORDER — ROCURONIUM BROMIDE 100 MG/10ML IV SOLN
INTRAVENOUS | Status: DC | PRN
  Administered 2014-09-06: 10 mg via INTRAVENOUS
  Administered 2014-09-06: 40 mg via INTRAVENOUS

## 2014-09-06 MED ORDER — DEXAMETHASONE SODIUM PHOSPHATE 10 MG/ML IJ SOLN
INTRAMUSCULAR | Status: DC | PRN
  Administered 2014-09-06: 10 mg via INTRAVENOUS

## 2014-09-06 MED ORDER — PROPOFOL 10 MG/ML IV BOLUS
INTRAVENOUS | Status: AC
  Filled 2014-09-06: qty 20

## 2014-09-06 MED ORDER — LACTATED RINGERS IV SOLN
INTRAVENOUS | Status: DC | PRN
  Administered 2014-09-06 (×2): via INTRAVENOUS

## 2014-09-06 MED ORDER — LIDOCAINE HCL (CARDIAC) 20 MG/ML IV SOLN
INTRAVENOUS | Status: DC | PRN
  Administered 2014-09-06: 70 mg via INTRAVENOUS

## 2014-09-06 MED ORDER — ACETAMINOPHEN 10 MG/ML IV SOLN
1000.0000 mg | Freq: Four times a day (QID) | INTRAVENOUS | Status: DC
  Administered 2014-09-06 – 2014-09-07 (×2): 1000 mg via INTRAVENOUS
  Filled 2014-09-06 (×3): qty 100

## 2014-09-06 MED ORDER — LIDOCAINE HCL (CARDIAC) 20 MG/ML IV SOLN
INTRAVENOUS | Status: AC
  Filled 2014-09-06: qty 10

## 2014-09-06 MED ORDER — PROPOFOL 10 MG/ML IV BOLUS
INTRAVENOUS | Status: DC | PRN
  Administered 2014-09-06: 140 mg via INTRAVENOUS
  Administered 2014-09-06: 20 mg via INTRAVENOUS

## 2014-09-06 MED ORDER — MIDAZOLAM HCL 2 MG/2ML IJ SOLN
INTRAMUSCULAR | Status: AC
  Filled 2014-09-06: qty 4

## 2014-09-06 MED ORDER — GLYCOPYRROLATE 0.2 MG/ML IJ SOLN
INTRAMUSCULAR | Status: AC
  Filled 2014-09-06: qty 2

## 2014-09-06 MED ORDER — DEXAMETHASONE SODIUM PHOSPHATE 10 MG/ML IJ SOLN
INTRAMUSCULAR | Status: AC
  Filled 2014-09-06: qty 1

## 2014-09-06 MED ORDER — BUPIVACAINE-EPINEPHRINE (PF) 0.25% -1:200000 IJ SOLN
INTRAMUSCULAR | Status: AC
  Filled 2014-09-06: qty 30

## 2014-09-06 MED ORDER — FENTANYL CITRATE (PF) 100 MCG/2ML IJ SOLN
INTRAMUSCULAR | Status: DC | PRN
  Administered 2014-09-06: 50 ug via INTRAVENOUS
  Administered 2014-09-06: 200 ug via INTRAVENOUS
  Administered 2014-09-06 (×3): 50 ug via INTRAVENOUS

## 2014-09-06 MED ORDER — DEXAMETHASONE 4 MG PO TABS
4.0000 mg | ORAL_TABLET | Freq: Four times a day (QID) | ORAL | Status: AC
  Administered 2014-09-06 – 2014-09-07 (×3): 4 mg via ORAL
  Filled 2014-09-06 (×3): qty 1

## 2014-09-06 MED ORDER — NEOSTIGMINE METHYLSULFATE 10 MG/10ML IV SOLN
INTRAVENOUS | Status: DC | PRN
  Administered 2014-09-06: 3 mg via INTRAVENOUS

## 2014-09-06 MED ORDER — PHENYLEPHRINE HCL 10 MG/ML IJ SOLN
10.0000 mg | INTRAVENOUS | Status: DC | PRN
  Administered 2014-09-06: 20 ug/min via INTRAVENOUS

## 2014-09-06 MED ORDER — HYDROMORPHONE HCL 1 MG/ML IJ SOLN: 0.2500 mg | INTRAMUSCULAR | Status: DC | PRN

## 2014-09-06 MED ORDER — MUPIROCIN 2 % EX OINT: 1.0000 "application " | TOPICAL_OINTMENT | Freq: Once | CUTANEOUS | Status: DC

## 2014-09-06 MED ORDER — DEXTROSE 5 % IV SOLN
500.0000 mg | Freq: Four times a day (QID) | INTRAVENOUS | Status: DC | PRN
  Filled 2014-09-06: qty 5

## 2014-09-06 MED ORDER — MORPHINE SULFATE 2 MG/ML IJ SOLN: 1.0000 mg | INTRAMUSCULAR | Status: DC | PRN

## 2014-09-06 MED ORDER — ONDANSETRON HCL 4 MG PO TABS: 4.0000 mg | ORAL_TABLET | Freq: Three times a day (TID) | ORAL | Status: AC | PRN

## 2014-09-06 MED ORDER — PHENYLEPHRINE HCL 10 MG/ML IJ SOLN
INTRAMUSCULAR | Status: DC | PRN
  Administered 2014-09-06: 120 ug via INTRAVENOUS
  Administered 2014-09-06: 80 ug via INTRAVENOUS

## 2014-09-06 MED ORDER — MENTHOL 3 MG MT LOZG
1.0000 | LOZENGE | OROMUCOSAL | Status: DC | PRN
  Filled 2014-09-06: qty 9

## 2014-09-06 MED ORDER — ONDANSETRON HCL 4 MG/2ML IJ SOLN
INTRAMUSCULAR | Status: AC
  Filled 2014-09-06: qty 2

## 2014-09-06 MED ORDER — OXYCODONE HCL 5 MG/5ML PO SOLN: 5.0000 mg | Freq: Once | ORAL | Status: DC | PRN

## 2014-09-06 MED ORDER — LACTATED RINGERS IV SOLN
INTRAVENOUS | Status: DC
  Administered 2014-09-06: 11:00:00 via INTRAVENOUS

## 2014-09-06 MED ORDER — THROMBIN 20000 UNITS EX SOLR
CUTANEOUS | Status: AC
  Filled 2014-09-06: qty 20000

## 2014-09-06 MED ORDER — FENTANYL CITRATE (PF) 250 MCG/5ML IJ SOLN
INTRAMUSCULAR | Status: AC
  Filled 2014-09-06: qty 5

## 2014-09-06 MED ORDER — THROMBIN 20000 UNITS EX SOLR
CUTANEOUS | Status: DC | PRN
  Administered 2014-09-06: 20 mL via TOPICAL

## 2014-09-06 MED ORDER — ACETAMINOPHEN 325 MG PO TABS: 325.0000 mg | ORAL_TABLET | ORAL | Status: DC | PRN

## 2014-09-06 MED ORDER — LACTATED RINGERS IV SOLN: INTRAVENOUS | Status: DC

## 2014-09-06 MED ORDER — SODIUM CHLORIDE 0.9 % IV SOLN: 250.0000 mL | INTRAVENOUS | Status: DC

## 2014-09-06 MED ORDER — BUPIVACAINE-EPINEPHRINE 0.25% -1:200000 IJ SOLN
INTRAMUSCULAR | Status: DC | PRN
  Administered 2014-09-06: 3 mL

## 2014-09-06 MED ORDER — ONDANSETRON HCL 4 MG/2ML IJ SOLN: 4.0000 mg | INTRAMUSCULAR | Status: DC | PRN

## 2014-09-06 MED ORDER — ACETAMINOPHEN 10 MG/ML IV SOLN
INTRAVENOUS | Status: AC
  Filled 2014-09-06: qty 100

## 2014-09-06 MED ORDER — GLYCOPYRROLATE 0.2 MG/ML IJ SOLN
INTRAMUSCULAR | Status: DC | PRN
Start: 2014-09-06 — End: 2014-09-06
  Administered 2014-09-06: 0.4 mg via INTRAVENOUS

## 2014-09-06 MED ORDER — SODIUM CHLORIDE 0.9 % IJ SOLN
3.0000 mL | Freq: Two times a day (BID) | INTRAMUSCULAR | Status: DC
  Administered 2014-09-06: 3 mL via INTRAVENOUS

## 2014-09-06 MED ORDER — MIDAZOLAM HCL 5 MG/5ML IJ SOLN
INTRAMUSCULAR | Status: DC | PRN
  Administered 2014-09-06: 2 mg via INTRAVENOUS

## 2014-09-06 MED ORDER — MUPIROCIN 2 % EX OINT
1.0000 "application " | TOPICAL_OINTMENT | Freq: Once | CUTANEOUS | Status: AC
  Administered 2014-09-06: 1 via TOPICAL
  Filled 2014-09-06: qty 22

## 2014-09-06 MED ORDER — ONDANSETRON HCL 4 MG/2ML IJ SOLN
INTRAMUSCULAR | Status: DC | PRN
  Administered 2014-09-06: 4 mg via INTRAVENOUS

## 2014-09-06 MED ORDER — LIDOCAINE HCL 4 % MT SOLN
OROMUCOSAL | Status: DC | PRN
Start: 2014-09-06 — End: 2014-09-06
  Administered 2014-09-06: 4 mL via TOPICAL

## 2014-09-06 MED ORDER — DEXAMETHASONE SODIUM PHOSPHATE 4 MG/ML IJ SOLN
4.0000 mg | Freq: Four times a day (QID) | INTRAMUSCULAR | Status: AC
  Administered 2014-09-07: 4 mg via INTRAVENOUS
  Filled 2014-09-06 (×3): qty 1

## 2014-09-06 MED ORDER — ACETAMINOPHEN 160 MG/5ML PO SOLN: 325.0000 mg | ORAL | Status: DC | PRN

## 2014-09-06 SURGICAL SUPPLY — 62 items
BENZOIN TINCTURE PRP APPL 2/3 (GAUZE/BANDAGES/DRESSINGS) IMPLANT
BLADE SURG ROTATE 9660 (MISCELLANEOUS) IMPLANT
BUR EGG ELITE 4.0 (BURR) IMPLANT
BUR EGG ELITE 4.0MM (BURR)
BUR MATCHSTICK NEURO 3.0 LAGG (BURR) IMPLANT
CANISTER SUCTION 2500CC (MISCELLANEOUS) ×3 IMPLANT
CLOSURE STERI-STRIP 1/2X4 (GAUZE/BANDAGES/DRESSINGS) ×1
CLSR STERI-STRIP ANTIMIC 1/2X4 (GAUZE/BANDAGES/DRESSINGS) ×2 IMPLANT
CORDS BIPOLAR (ELECTRODE) ×3 IMPLANT
COVER SURGICAL LIGHT HANDLE (MISCELLANEOUS) ×6 IMPLANT
CRADLE DONUT ADULT HEAD (MISCELLANEOUS) ×3 IMPLANT
DRAPE C-ARM 42X72 X-RAY (DRAPES) ×3 IMPLANT
DRAPE POUCH INSTRU U-SHP 10X18 (DRAPES) ×3 IMPLANT
DRAPE SURG 17X23 STRL (DRAPES) ×3 IMPLANT
DRAPE U-SHAPE 47X51 STRL (DRAPES) ×6 IMPLANT
DRSG MEPILEX BORDER 4X4 (GAUZE/BANDAGES/DRESSINGS) ×3 IMPLANT
ELECT COATED BLADE 2.86 ST (ELECTRODE) ×3 IMPLANT
ELECT PENCIL ROCKER SW 15FT (MISCELLANEOUS) ×3 IMPLANT
ELECT REM PT RETURN 9FT ADLT (ELECTROSURGICAL) ×3
ELECTRODE REM PT RTRN 9FT ADLT (ELECTROSURGICAL) ×1 IMPLANT
GLOVE BIOGEL PI IND STRL 8 (GLOVE) ×1 IMPLANT
GLOVE BIOGEL PI IND STRL 8.5 (GLOVE) ×1 IMPLANT
GLOVE BIOGEL PI INDICATOR 8 (GLOVE) ×2
GLOVE BIOGEL PI INDICATOR 8.5 (GLOVE) ×2
GLOVE ORTHO TXT STRL SZ7.5 (GLOVE) ×3 IMPLANT
GLOVE SS BIOGEL STRL SZ 8.5 (GLOVE) ×2 IMPLANT
GLOVE SUPERSENSE BIOGEL SZ 8.5 (GLOVE) ×4
GOWN STRL REUS W/ TWL XL LVL3 (GOWN DISPOSABLE) ×2 IMPLANT
GOWN STRL REUS W/TWL 2XL LVL3 (GOWN DISPOSABLE) ×6 IMPLANT
GOWN STRL REUS W/TWL XL LVL3 (GOWN DISPOSABLE) ×4
IMPLANT MEDIUM TCS 7MM (Neuro Prosthesis/Implant) ×3 IMPLANT
KIT BASIN OR (CUSTOM PROCEDURE TRAY) ×3 IMPLANT
KIT ROOM TURNOVER OR (KITS) ×3 IMPLANT
NEEDLE SPNL 18GX3.5 QUINCKE PK (NEEDLE) ×3 IMPLANT
NS IRRIG 1000ML POUR BTL (IV SOLUTION) ×3 IMPLANT
PACK ORTHO CERVICAL (CUSTOM PROCEDURE TRAY) ×3 IMPLANT
PACK UNIVERSAL I (CUSTOM PROCEDURE TRAY) ×3 IMPLANT
PAD ARMBOARD 7.5X6 YLW CONV (MISCELLANEOUS) ×6 IMPLANT
PATTIES SURGICAL .25X.25 (GAUZE/BANDAGES/DRESSINGS) ×6 IMPLANT
PATTIES SURGICAL .5 X.5 (GAUZE/BANDAGES/DRESSINGS) ×3 IMPLANT
PUTTY BONE DBX 2.5 MIS (Bone Implant) ×3 IMPLANT
RESTRAINT LIMB HOLDER UNIV (RESTRAINTS) ×3 IMPLANT
SCREW LOCKING 14MMX3.5MM (Screw) ×6 IMPLANT
SPONGE INTESTINAL PEANUT (DISPOSABLE) ×12 IMPLANT
SPONGE LAP 4X18 X RAY DECT (DISPOSABLE) IMPLANT
SPONGE SURGIFOAM ABS GEL 100 (HEMOSTASIS) ×3 IMPLANT
SURGIFLO W/THROMBIN 8M KIT (HEMOSTASIS) ×3 IMPLANT
SUT BONE WAX W31G (SUTURE) ×3 IMPLANT
SUT MON AB 3-0 SH 27 (SUTURE) ×2
SUT MON AB 3-0 SH27 (SUTURE) ×1 IMPLANT
SUT SILK 2 0 REEL (SUTURE) ×3 IMPLANT
SUT SILK 2 0 TIES 10X30 (SUTURE) ×3 IMPLANT
SUT VIC AB 2-0 CT1 36 (SUTURE) ×3 IMPLANT
SYR BULB IRRIGATION 50ML (SYRINGE) ×3 IMPLANT
SYR CONTROL 10ML LL (SYRINGE) ×3 IMPLANT
TAPE CLOTH 4X10 WHT NS (GAUZE/BANDAGES/DRESSINGS) ×3 IMPLANT
TAPE UMBILICAL COTTON 1/8X30 (MISCELLANEOUS) ×6 IMPLANT
TOWEL OR 17X24 6PK STRL BLUE (TOWEL DISPOSABLE) ×3 IMPLANT
TOWEL OR 17X26 10 PK STRL BLUE (TOWEL DISPOSABLE) ×3 IMPLANT
TRAY FOLEY CATH 16FRSI W/METER (SET/KITS/TRAYS/PACK) IMPLANT
WATER STERILE IRR 1000ML POUR (IV SOLUTION) IMPLANT
YANKAUER SUCT BULB TIP NO VENT (SUCTIONS) ×3 IMPLANT

## 2014-09-06 NOTE — Discharge Instructions (Signed)

## 2014-09-06 NOTE — Brief Op Note (Signed)
09/06/2014  2:53 PM  PATIENT:  Michaela Lopez  54 y.o. female  PRE-OPERATIVE DIAGNOSIS:  stenosis  POST-OPERATIVE DIAGNOSIS:  stenosis  PROCEDURE:  Procedure(s): ANTERIOR CERVICAL DECOMPRESSION/DISCECTOMY FUSION 1 LEVEL/HARDWARE REMOVAL (N/A)  SURGEON:  Surgeon(s) and Role:    * Venita Lick, MD - Primary  PHYSICIAN ASSISTANT:   ASSISTANTS: Carmen Mayo   ANESTHESIA:   general  EBL:  Total I/O In: 1400 [I.V.:1400] Out: 20 [Blood:20]  BLOOD ADMINISTERED:none  DRAINS: none   LOCAL MEDICATIONS USED:  MARCAINE     SPECIMEN:  No Specimen  DISPOSITION OF SPECIMEN:  N/A  COUNTS:  YES  TOURNIQUET:  * No tourniquets in log *  DICTATION: .Other Dictation: Dictation Number [id number]  PLAN OF CARE: Admit for overnight observation  PATIENT DISPOSITION:  PACU - hemodynamically stable.

## 2014-09-06 NOTE — H&P (Signed)
History of Present Illness  The patient is a 54 year old female who comes in today for a preoperative History and Physical. The patient is scheduled for a ACDF C5-6, possible hardware removal to be performed by Dr. Debria Garret D. Shon Baton, MD at [facility] on 09-06-14 . Please see the hospital record for complete dictated history and physical. she has radicular pain into bilateral arms L>R.  Allergies  No Known Drug Allergies11/13/2013  Family History Cancer Sister. sister  Social History Tobacco use Never smoker. never smoker Tobacco / smoke exposure no Current occupation [employer] Marital status single Current work status OUt of work Pain Contract no Number of flights of stairs before winded 1 Drug/Alcohol Rehab (Currently) no Drug/Alcohol Rehab (Previously) no Exercise Exercises daily; does other Alcohol use current drinker; only occasionally per week Children 2 Illicit drug use no  Medication History  Benadryl Allergy (25MG  Capsule, Oral) Active. (qhs) Norco (5-325MG  Tablet, 1 (one) Oral PO Q TID, Taken starting 08/11/2014) Inactive. (DDB/RCY) Medications Reconciled  Past Surgical History Foot Surgery bilateral Spinal Fusion - Neck Cesarean Delivery 2 times  Vitals 09/04/2014 8:32 AM Weight: 182 lb Height: 63in Body Surface Area: 1.86 m Body Mass Index: 32.24 kg/m  Temp.: 98.22F(Oral)  Pulse: 89 (Regular)  BP: 158/84 (Sitting, Right Arm, Standard)  Physical Exam  General General Appearance-Not in acute distress. Orientation-Oriented X3. Build & Nutrition-Well nourished and Well developed.  Integumentary General Characteristics Surgical Scars - anterior neck surgical scarring consistent with previous cervical surgery. Cervical Spine-Skin examination of the cervical spine is without deformity, skin lesions, lacerations or abrasions.  Chest and Lung Exam Auscultation Breath sounds - Normal and  Clear.  Cardiovascular Auscultation Rhythm - Regular rate and rhythm.  Abdomen Palpation/Percussion Palpation and Percussion of the abdomen reveal - Non Tender, No Rebound tenderness and No Rigidity (guarding).  Peripheral Vascular Upper Extremity Palpation - Radial pulse - Bilateral - 2+.  Neurologic Sensation Upper Extremity - Left - sensation is diminished in the upper extremity. Right - sensation is intact in the upper extremity. Reflexes Biceps Reflex - Bilateral - 2+. Brachioradialis Reflex - Bilateral - 2+. Triceps Reflex - Bilateral - 2+. Hoffman's Sign - Bilateral - Hoffman's sign present.  Musculoskeletal Spine/Ribs/Pelvis  Cervical Spine : Inspection and Palpation - Tenderness - right cervical paraspinals tender to palpation and left cervical paraspinals tender to palpation. Strength and Tone: Strength: Strength: Strength - Deltoid - Bilateral - 3/5. Right - 4-/5. Biceps - Left - 3/5. Triceps - Left - 3-/5. Right - 4-/5. Wrist Extension - Left - 3/5. Right - 4-/5. Hand Grip - Bilateral - 4-/5. Heel walk - Bilateral - able to heel walk without difficulty. Toe Walk - Bilateral - able to walk on toes without difficulty. Heel-Toe Walk - Bilateral - able to heel-toe walk without difficulty. ROM - Flexion - Severely Decreased and painful. Extension - Severely Decreased and painful. Left Lateral Flexion - Moderately Decreased and painful. Right Lateral Flexion - Moderately Decreased and painful. Left Rotation - Moderately Decreased and painful. Right Rotation - Moderately Decreased and painful. Pain - neither flexion or extension is more painful than the other. Cervical Spine - Special Testing - axial compression test negative, cross chest impingement test negative. Non-Anatomic Signs - No non-anatomic signs present. Upper Extremity Range of Motion - No truesholder pain with IR/ER of the shoulders.  Assessment & Plan Note:54 year old female pt with severe neck pain and decreased ROM  of her neck. She has bilateral radicular pain and weakness  in UE. Pt is having ACDF on 09/06/14. 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 This is at the C5-6 level. She had had an evaluation on 06/30/2014 by the ENT specialist. There were no abnormalities, normal vocal cord motion. Given this, we will approach the C5-6 level from the right side, which is the contralateral side to her previous left-sided incision. She had a two-level fusion and so, my hope is that I can just simply do a zero profile 5-6 implant and I do not have to take out the previous hardware. If I have to take out the previous hardware, this will require me dissecting superiorly, which could increase the likelihood of dysphagia or dysphonia. I have explained the risk to her which include infection, bleeding, nerve damage, death, stroke, paralysis, failure to heal, need for further surgery, ongoing or worse pain, throat pain, swallowing difficulties, hoarseness in the voice. All of her questions were encouraged. As I indicated to her the principal goal of this surgery to prevent the progression of her myelopathy. The secondary goal would be to improve her function and improve her pain. All of her questions were encouraged and addressed. I will see her again on the date of surgery later this week.

## 2014-09-06 NOTE — Anesthesia Postprocedure Evaluation (Signed)
  Anesthesia Post-op Note  Patient: Michaela Lopez  Procedure(s) Performed: Procedure(s): ANTERIOR CERVICAL DECOMPRESSION/DISCECTOMY FUSION 1 LEVEL (N/A)  Patient Location: PACU  Anesthesia Type:General  Level of Consciousness: awake  Airway and Oxygen Therapy: Patient Spontanous Breathing  Post-op Pain: mild  Post-op Assessment: Post-op Vital signs reviewed, Patient's Cardiovascular Status Stable, Respiratory Function Stable, Patent Airway, No signs of Nausea or vomiting and Pain level controlled LLE Motor Response: Purposeful movement   RLE Motor Response: Purposeful movement        Post-op Vital Signs: Reviewed and stable  Last Vitals:  Filed Vitals:   09/06/14 1630  BP: 127/63  Pulse: 105  Temp: 36.9 C  Resp: 17    Complications: No apparent anesthesia complications

## 2014-09-06 NOTE — Transfer of Care (Signed)
Immediate Anesthesia Transfer of Care Note  Patient: Michaela Lopez  Procedure(s) Performed: Procedure(s): ANTERIOR CERVICAL DECOMPRESSION/DISCECTOMY FUSION 1 LEVEL (N/A)  Patient Location: PACU  Anesthesia Type:General  Level of Consciousness: awake, alert  and oriented  Airway & Oxygen Therapy: Patient Spontanous Breathing and Patient connected to nasal cannula oxygen  Post-op Assessment: Report given to RN, Post -op Vital signs reviewed and stable and Patient moving all extremities X 4  Post vital signs: Reviewed and stable  Last Vitals:  Filed Vitals:   09/06/14 1519  BP:   Pulse:   Temp: 37 C  Resp:     Complications: No apparent anesthesia complications

## 2014-09-06 NOTE — Anesthesia Procedure Notes (Signed)
Procedure Name: Intubation Date/Time: 09/06/2014 12:01 PM Performed by: Garrison Columbus T Pre-anesthesia Checklist: Emergency Drugs available, Patient being monitored, Patient identified and Suction available Patient Re-evaluated:Patient Re-evaluated prior to inductionOxygen Delivery Method: Circle system utilized Preoxygenation: Pre-oxygenation with 100% oxygen Intubation Type: IV induction Ventilation: Mask ventilation without difficulty Laryngoscope Size: Miller and 2 Grade View: Grade II Tube type: Oral Tube size: 7.5 mm Number of attempts: 1 Airway Equipment and Method: Stylet and LTA kit utilized Placement Confirmation: ETT inserted through vocal cords under direct vision,  positive ETCO2 and breath sounds checked- equal and bilateral Secured at: 21 cm Tube secured with: Tape Dental Injury: Teeth and Oropharynx as per pre-operative assessment

## 2014-09-06 NOTE — Anesthesia Preprocedure Evaluation (Addendum)
Anesthesia Evaluation  Patient identified by MRN, date of birth, ID band Patient awake    Reviewed: Allergy & Precautions, NPO status , Patient's Chart, lab work & pertinent test results  History of Anesthesia Complications Negative for: history of anesthetic complications  Airway Mallampati: III  TM Distance: >3 FB Neck ROM: Limited  Mouth opening: Limited Mouth Opening  Dental  (+) Dental Advisory Given,    Pulmonary neg pulmonary ROS,  breath sounds clear to auscultation        Cardiovascular negative cardio ROS  Rhythm:Regular     Neuro/Psych  Headaches, Neck pain with left > right radiculopathy negative psych ROS   GI/Hepatic negative GI ROS, Neg liver ROS,   Endo/Other  negative endocrine ROS  Renal/GU negative Renal ROS     Musculoskeletal   Abdominal   Peds  Hematology  (+) Blood dyscrasia, Sickle cell trait ,   Anesthesia Other Findings   Reproductive/Obstetrics                            Anesthesia Physical Anesthesia Plan  ASA: II  Anesthesia Plan: General   Post-op Pain Management:    Induction: Intravenous  Airway Management Planned: Oral ETT  Additional Equipment: None  Intra-op Plan:   Post-operative Plan: Extubation in OR  Informed Consent: I have reviewed the patients History and Physical, chart, labs and discussed the procedure including the risks, benefits and alternatives for the proposed anesthesia with the patient or authorized representative who has indicated his/her understanding and acceptance.   Dental advisory given  Plan Discussed with: CRNA, Anesthesiologist and Surgeon  Anesthesia Plan Comments:        Anesthesia Quick Evaluation

## 2014-09-07 ENCOUNTER — Encounter (HOSPITAL_COMMUNITY): Payer: Self-pay | Admitting: Orthopedic Surgery

## 2014-09-07 DIAGNOSIS — M4712 Other spondylosis with myelopathy, cervical region: Secondary | ICD-10-CM | POA: Diagnosis not present

## 2014-09-07 MED ORDER — ACETAMINOPHEN 500 MG PO TABS: 1000.0000 mg | ORAL_TABLET | Freq: Four times a day (QID) | ORAL | Status: DC

## 2014-09-07 MED ORDER — CHLORHEXIDINE GLUCONATE CLOTH 2 % EX PADS
6.0000 | MEDICATED_PAD | Freq: Every day | CUTANEOUS | Status: DC
  Administered 2014-09-07: 6 via TOPICAL

## 2014-09-07 MED ORDER — MUPIROCIN 2 % EX OINT
1.0000 "application " | TOPICAL_OINTMENT | Freq: Two times a day (BID) | CUTANEOUS | Status: DC
  Administered 2014-09-07: 1 via NASAL

## 2014-09-07 NOTE — Progress Notes (Signed)
Patient alert and oriented, mae's well, voiding adequate amount of urine, swallowing without difficulty, no c/o pain. Patient discharged home with family. Script and discharged instructions given to patient. Patient and family stated understanding of d/c instructions given and has an appointment with MD. 

## 2014-09-07 NOTE — Progress Notes (Addendum)
Patient ID: Michaela Lopez, female   DOB: 05-Mar-1960, 54 y.o.   MRN: [id number]    Subjective:  1 Day Post-Op Procedure(s) (LRB): ANTERIOR CERVICAL DECOMPRESSION/DISCECTOMY FUSION 1 LEVEL (N/A) Patient reports pain as 6 on 0-10 scale.   Denies CP or SOB.  Voiding without difficulty. Positive flatus. Objective: Vital signs in last 24 hours: Temp:  [97.7 F (36.5 C)-98.7 F (37.1 C)] 98.2 F (36.8 C) (08/11 0728) Pulse Rate:  [63-105] 89 (08/11 0728) Resp:  [10-25] 18 (08/11 0728) BP: (123-159)/(56-80) 132/79 mmHg (08/11 0728) SpO2:  [97 %-100 %] 99 % (08/11 0728) Weight:  [81.194 kg (179 lb)] 81.194 kg (179 lb) (08/10 1022)  Intake/Output from previous day: 08/10 0701 - 08/11 0700 In: 1400 [I.V.:1400] Out: 20 [Blood:20] Intake/Output this shift:    Labs:  Recent Labs  09/06/14 1027  HGB 13.2    Recent Labs  09/06/14 1027  WBC 8.2  RBC 4.66  HCT 38.2  PLT 428*    Recent Labs  09/06/14 1027  NA 141  K 3.3*  CL 107  CO2 22  BUN 8  CREATININE 0.83  GLUCOSE 98  CALCIUM 9.3   No results for input(s): LABPT, INR in the last 72 hours.  Physical Exam: Neurologically intact ABD soft Sensation intact distally Incision: dressing C/D/I and no drainage Compartment soft  Assessment/Plan: 1 Day Post-Op Procedure(s) (LRB): ANTERIOR CERVICAL DECOMPRESSION/DISCECTOMY FUSION 1 LEVEL (N/A) Advance diet Up with therapy  The pt had refused pain medication earlier - nurse will now administer Consider DC today per Dr. Chad Cordial, [name] for Dr. Venita Lick [facility] [phone] 09/07/2014, 7:29 AM  Pt doing well  Ok for d/c - will f/u in 2 weeks

## 2014-09-07 NOTE — Progress Notes (Signed)
SCHEDULED IV ACETAMINOPHEN:  CONVERSION TO ORAL ROUTE to complete the ordered doses.  The Pharmacy and Therapeutics Committee has restricted administration of IV acetaminophen (with a 24 hr maximum duration) to patients who meet both of the following criteria:  Unable to tolerate oral or enteral medication  Contraindication to NSAIDs  Because the patient has taken other oral medications today (dexamethason and oxycodone IR), IV acetaminophen has been converted to PO to complete the course of therapy originally ordered.  If PO acetaminophen should be continued beyond the original stop time, please adjust the order accordingly using the "modify" function.   If you have questions about this conversion, please contact the pharmacy department.  [name], [facility] 09/07/2014 8:37 AM

## 2014-09-07 NOTE — Op Note (Signed)
NAMEJAIYANNA, SAFRAN                ACCOUNT NO.:  [id number]  MEDICAL RECORD NO.:  [id number]  LOCATION:  3C06C                        FACILITY:  [facility]  PHYSICIAN:  Duwan Adrian D. Shon Baton, M.D. DATE OF BIRTH:  25-Feb-1960  DATE OF PROCEDURE:  09/06/2014 DATE OF DISCHARGE:                              OPERATIVE REPORT   PREOPERATIVE DIAGNOSIS:  Cervical spondylotic myelopathy, C5-6.  POSTOPERATIVE DIAGNOSIS:  Cervical spondylotic myelopathy, C5-6.  OPERATIVE PROCEDURE:  Anterior cervical diskectomy and fusion, C5-6 utilizing the Titan intervertebral cage, packed with DBX Mix with 0 profile screw fixation through the cage [facility] with screws with a 7-mm medium lordotic cage).  COMPLICATIONS:  None.  FIRST ASSISTANT:  Anette Riedel, [place].  HISTORY:  This is a very pleasant woman who several years ago underwent an anterior cervical diskectomy and fusion at C3-4 and C4-5 without complication.  Recently, she began having difficulty walking, bilateral upper extremity pain and unstable gait pattern.  MRI imaging demonstrated an adjacent segment hard disk osteophyte causing cord compression and myelomalacia.  As a result of the patient having clinical signs of myelopathy in the radiographic findings, we elected to proceed with surgery.  All appropriate risks, benefits, and alternatives were discussed and consent was obtained.  OPERATIVE NOTE:  The patient was brought to the operating room and placed supine on the operating table.  After successful induction of general anesthesia and endotracheal intubation, TEDs and SCDs were applied.  Inflatable cuff was placed underneath the shoulder blades and the anterior cervical spine was prepped and draped in a standard fashion.  A time-out was taken to confirm patient, procedure, and all other pertinent important data.  The arms were secured to the thigh. Time-out was taken to confirm patient, procedure, and all other pertinent important positives.  The  C5-6 level was identified on x-ray and proposed incision site was infiltrated with 0.25% Marcaine with epi. At this time, a right-sided transverse incision was made starting from the midline.  Sharp dissection was carried out down to the platysma. Platysma was sharply incised and identified the sternocleidomastoid, began dissecting on the medial border of this, taking my dissection into the deep cervical fascia.  The omohyoid was identified and sacrificed. I then continued into the prevertebral fascia and I identified the carotid sheath as well as the esophagus.  These were both swept out of the way and protected with retractors and I continued to dissect until I could see the plate from the previous fusion.  Once I saw this, I began dissecting inferiorly to it, mobilizing the scar tissue and prevertebral fascia to expose down to the midportion of the C6 vertebral body.  There was a large overhanging osteophyte that was clearly seen on the preoperative x-ray.  Self-retaining retractors were placed underneath the longus colli muscle.  The endotracheal cuff was expanded as was the retractor and the endotracheal cuff was reinflated.  I then removed the osteophyte with a 3-mm Kerrison rongeur.  I could then see the disk space of C5-6 clearly.  I placed a needle into this disk space and took an x-ray and confirmed that I was at the appropriate level.  Once this was done, an annulotomy was  performed with a 15-blade scalpel and then the pituitary rongeur was used to remove all of the disk material.  Once this was done, I then placed distraction pins into the body of C5 and C6, distracted the intervertebral space with laminar spreader and maintained the distraction with distraction device.  I then continued to work posteriorly using fine nerve hooks and curettes to resect the remaining disk and annulus.  Once I was down far enough, I then took my nerve hook and continued to dissect through the  posterior longitudinal ligament.  I developed a plane between the longitudinal ligament and the thecal sac and used 1-mm Kerrison to resect the posterior longitudinal ligament.  I could now visualize the anterior surface of the thecal sac. I then removed the osteophytes from the inferior aspect of the C5 and C6 vertebral body with my 1 mm Kerrison punch.  I then traced out underneath the uncovertebral joint and decompress this as well.  At this point, I had removed all of the hard disk central osteophytes and visualized the thecal sac.  I could freely pass my nerve hook underneath the endplates at the level of the disk space.  At this point, satisfied with my decompression.  I rasped the endplates, placed a 7-mm Titan with screw cage, packed with DBX Mix.  Once this was properly seated, I then used my awl to broach the cortex and placed 14-mm screws, one superiorly and one inferiorly and locked it into place.  The screws were 14 mm in length.  They both had excellent purchase and they were locking down to the plate, past the anti-kick portion of the plate according to manufacture's standards.  I then irrigated the wound copiously with normal saline and confirmed hemostasis using bipolar electrocautery and FloSeal.  I then returned the trachea and esophagus to midline, and I closed the platysma with 2-0 Vicryl interrupted.  After the 2-0 Vicryl interrupted layer, I then closed the skin with 3-0 Monocryl.  Steri- Strips and then dry dressings were applied and the patient was ultimately extubated and transferred to the PACU without any incident. At the end of the case, all needle and sponge counts were correct and there were no intraoperative events.     Chrishaun Sasso D. Shon Baton, M.D.     DDB/MEDQ  D:  09/06/2014  T:  09/07/2014  Job:  [id number]

## 2014-09-07 NOTE — Progress Notes (Signed)
PT Cancellation Note  Patient Details Name: Michaela Lopez MRN: [id number] DOB: 04-Aug-1960   Cancelled Treatment:    Reason Eval/Treat Not Completed: PT screened, no needs identified, will sign off. Pt mobilizing without difficulty.   Azeneth Carbonell 09/07/2014, 9:28 AM  Skip Mayer PT [phone]

## 2014-09-07 NOTE — Progress Notes (Signed)
OT Cancellation Note  Patient Details Name: Michaela Lopez MRN: [id number] DOB: July 21, 1960   Cancelled Treatment:    Reason Eval/Treat Not Completed: OT screened, no needs identified, will sign off. Pt will have assist at home with ADLs  Galen Manila 09/07/2014, 9:34 AM

## 2014-09-11 ENCOUNTER — Encounter (HOSPITAL_COMMUNITY): Payer: Self-pay | Admitting: Orthopedic Surgery

## 2014-10-09 NOTE — Discharge Summary (Addendum)
Patient ID: Michaela Lopez MRN: [id number] DOB/AGE: December 04, 1960 54 y.o.  Admit date: 09/06/2014 Discharge date: 10/09/2014  Admission Diagnoses:  Active Problems:   Myelopathy   Discharge Diagnoses:  Active Problems:   Myelopathy  status post Procedure(s): ANTERIOR CERVICAL DECOMPRESSION/DISCECTOMY FUSION 1 LEVEL  Past Medical History  Diagnosis Date  . Headache(784.0)   . Medical history non-contributory   . H/O tubal ligation   . Pneumonia   . Sickle cell trait     Surgeries: Procedure(s): ANTERIOR CERVICAL DECOMPRESSION/DISCECTOMY FUSION 1 LEVEL on 09/06/2014   Consultants:    Discharged Condition: Improved  Hospital Course: Michaela Lopez is an 54 y.o. female who was admitted 09/06/2014 for operative treatment of Cervical Stenosis with myelopathy. Patient failed conservative treatments (please see the history and physical for the specifics) and had severe unremitting pain that affects sleep, daily activities and work/hobbies. After pre-op clearance, the patient was taken to the operating room on 09/06/2014 and underwent  Procedure(s): ANTERIOR CERVICAL DECOMPRESSION/DISCECTOMY FUSION 1 LEVEL.  Her hospital course was uneventful and she was discharged on 09/07/14.  Patient was given perioperative antibiotics:  Anti-infectives    Start     Dose/Rate Route Frequency Ordered Stop   09/06/14 1715  ceFAZolin (ANCEF) IVPB 1 g/50 mL premix     1 g 100 mL/hr over 30 Minutes Intravenous Every 8 hours 09/06/14 1704 09/07/14 0135   09/06/14 1018  ceFAZolin (ANCEF) IVPB 2 g/50 mL premix     2 g 100 mL/hr over 30 Minutes Intravenous 30 min pre-op 09/06/14 1018 09/06/14 1215   09/06/14 1016  ceFAZolin (ANCEF) 2-3 GM-% IVPB SOLR    Comments:  Kathrynn Humble   : cabinet override      09/06/14 1016 09/06/14 2229       Patient was given sequential compression devices and early ambulation to prevent DVT.   Patient benefited maximally from hospital stay and there were no  complications. At the time of discharge, the patient was urinating/moving their bowels without difficulty, tolerating a regular diet, pain is controlled with oral pain medications and they have been cleared by PT/OT.   Recent vital signs: No data found.    Recent laboratory studies: No results for input(s): WBC, HGB, HCT, PLT, NA, K, CL, CO2, BUN, CREATININE, GLUCOSE, INR, CALCIUM in the last 72 hours.  Invalid input(s): PT, 2   Discharge Medications:     Medication List    STOP taking these medications        HYDROcodone-acetaminophen 5-325 MG per tablet  Commonly known as:  NORCO/VICODIN     polyethylene glycol powder powder  Commonly known as:  GLYCOLAX      TAKE these medications        diphenhydrAMINE 25 MG tablet  Commonly known as:  BENADRYL  Take 25 mg by mouth every 6 (six) hours as needed for allergies.     docusate sodium 100 MG capsule  Commonly known as:  COLACE  Take 1 capsule (100 mg total) by mouth 3 (three) times daily as needed for constipation.     methocarbamol 500 MG tablet  Commonly known as:  ROBAXIN  Take 1 tablet (500 mg total) by mouth 3 (three) times daily as needed for muscle spasms.     ondansetron 4 MG tablet  Commonly known as:  ZOFRAN  Take 1 tablet (4 mg total) by mouth every 8 (eight) hours as needed for nausea or vomiting.     oxyCODONE-acetaminophen 10-325 MG per  tablet  Commonly known as:  PERCOCET  Take 1 tablet by mouth every 4 (four) hours as needed for pain.        Diagnostic Studies: No results found.        Follow-up Information    Follow up with Alvy Beal, MD. Schedule an appointment as soon as possible for a visit in 2 weeks.   Specialty:  Orthopedic Surgery   Why:  If symptoms worsen, For suture removal, For wound re-check   Contact information:   [address] [place] [address] [phone]       Discharge Plan:  discharge to home    Signed: [place] Kail for Dr.  Venita Lick [facility] [phone] 10/09/2014, 12:41 PM

## 2014-10-26 ENCOUNTER — Ambulatory Visit: Payer: Self-pay | Admitting: Physician Assistant

## 2015-11-08 IMAGING — CR DG CERVICAL SPINE 2 OR 3 VIEWS
2 series · 2 of 2 positions shown · non-contrast
Comparison: None.

CLINICAL DATA: Status post ACDF.

EXAM:
CERVICAL SPINE - 2-3 VIEW

[AP]
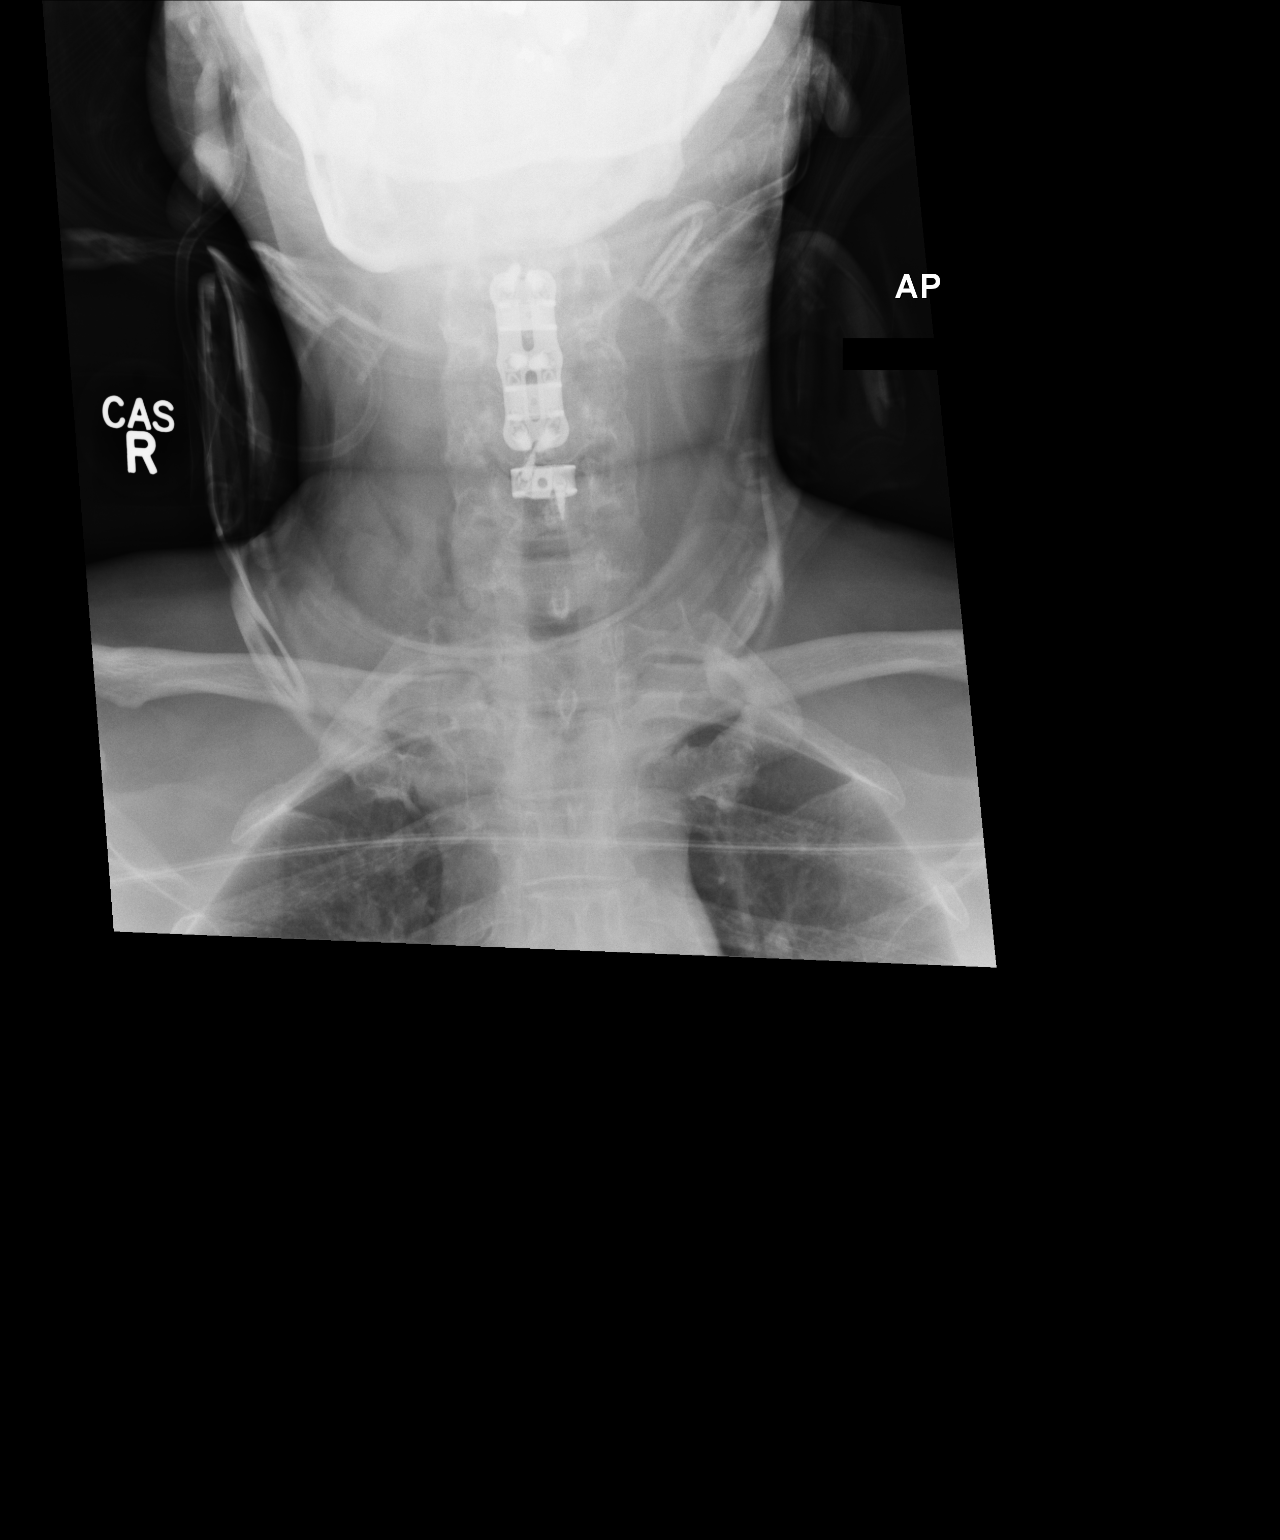

[xtable]
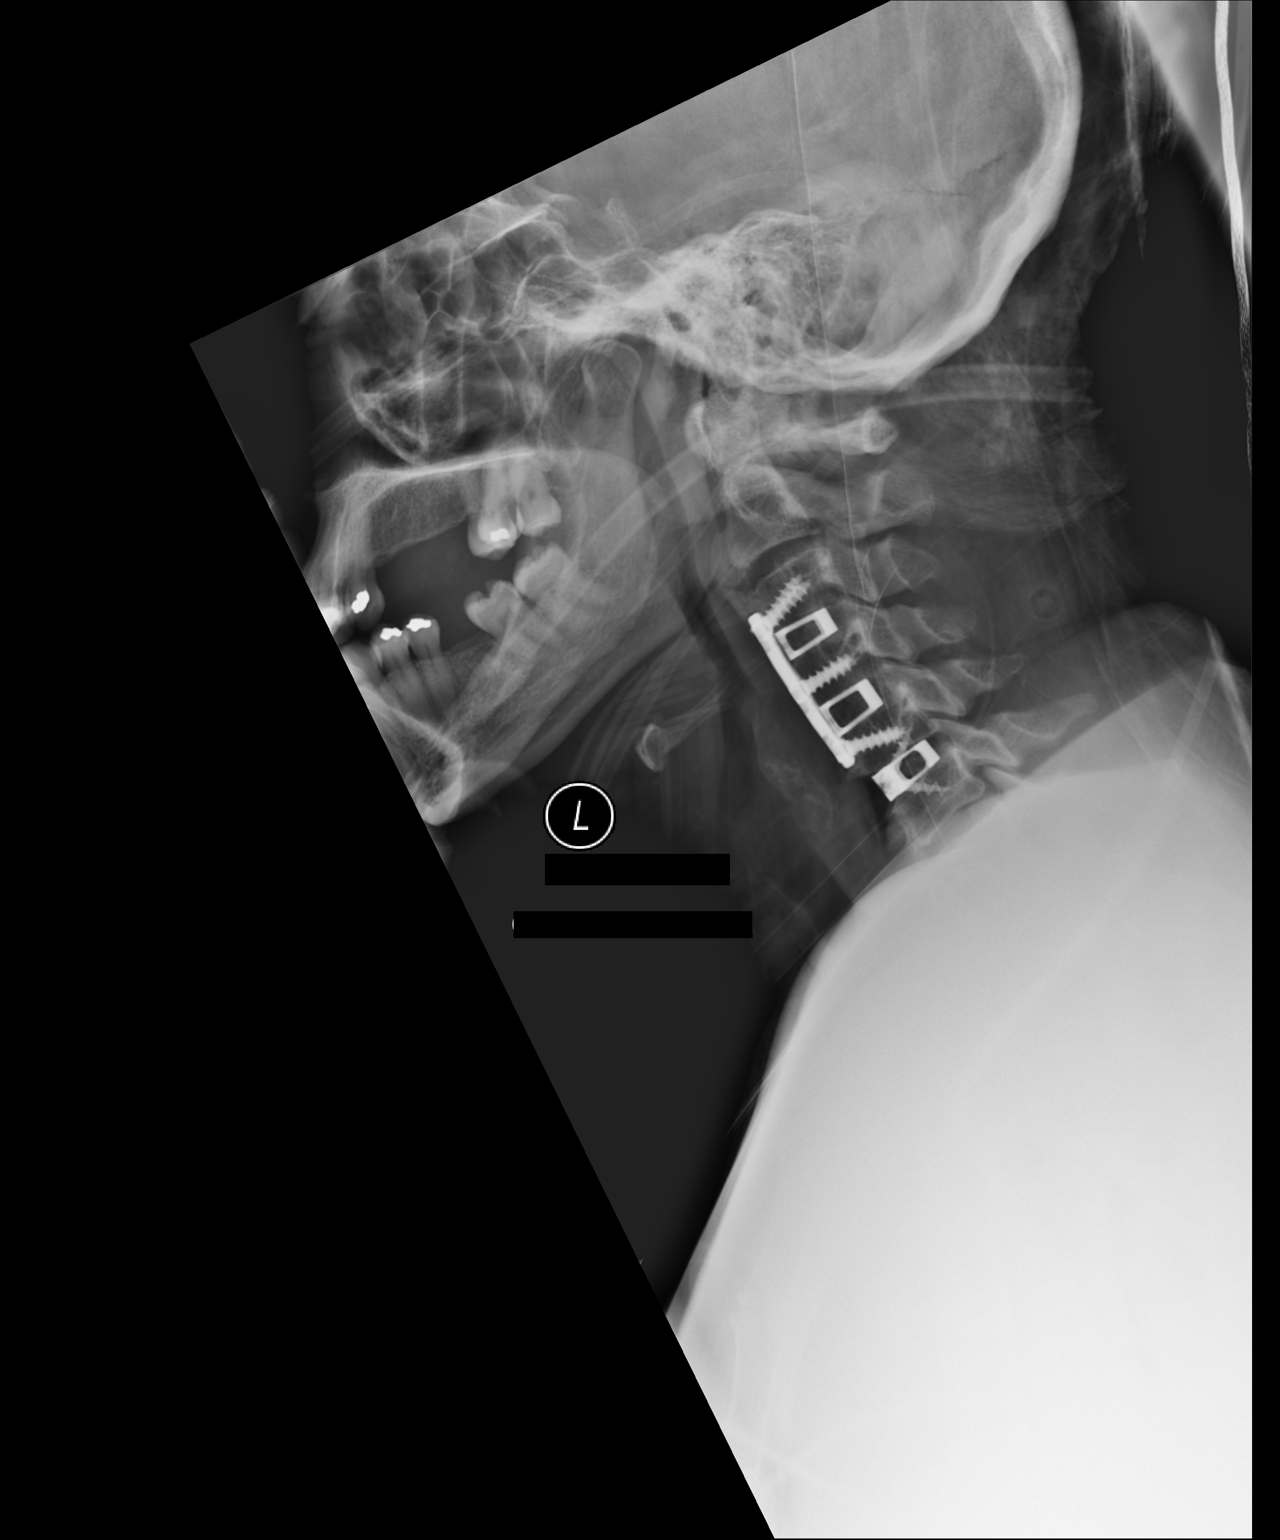

[2 of 2 positions shown; findings below may reference images not displayed]

FINDINGS: Interval anterior cervical fusion at C5-6 without failure or
complication. Prior anterior cervical fusion from C3 through C5
without failure or complication. Normal alignment.
IMPRESSION: Interval ACDF at C5-6.
